# Patient Record
Sex: Male | Born: 1975
Health system: Southern US, Community
[De-identification: ages and names within clinical notes are randomized; demographics above are authoritative.]

## PROBLEM LIST (undated history)

## (undated) DIAGNOSIS — K219 Gastro-esophageal reflux disease without esophagitis: Secondary | ICD-10-CM

## (undated) DIAGNOSIS — J45909 Unspecified asthma, uncomplicated: Secondary | ICD-10-CM

## (undated) DIAGNOSIS — E669 Obesity, unspecified: Secondary | ICD-10-CM

## (undated) HISTORY — PX: MOUTH SURGERY: SHX715

## (undated) HISTORY — DX: Obesity, unspecified: E66.9

## (undated) HISTORY — DX: Unspecified asthma, uncomplicated: J45.909

## (undated) HISTORY — DX: Gastro-esophageal reflux disease without esophagitis: K21.9

---

## 2000-10-12 ENCOUNTER — Inpatient Hospital Stay (HOSPITAL_COMMUNITY): Admission: EM | Admit: 2000-10-12 | Discharge: 2000-10-13 | Payer: Self-pay | Admitting: Emergency Medicine

## 2000-10-12 ENCOUNTER — Encounter: Payer: Self-pay | Admitting: Emergency Medicine

## 2000-10-15 ENCOUNTER — Encounter: Payer: Self-pay | Admitting: Emergency Medicine

## 2000-10-15 ENCOUNTER — Emergency Department (HOSPITAL_COMMUNITY): Admission: EM | Admit: 2000-10-15 | Discharge: 2000-10-15 | Payer: Self-pay | Admitting: Emergency Medicine

## 2006-02-06 ENCOUNTER — Emergency Department (HOSPITAL_COMMUNITY): Admission: EM | Admit: 2006-02-06 | Discharge: 2006-02-06 | Payer: Self-pay | Admitting: Emergency Medicine

## 2011-01-10 ENCOUNTER — Ambulatory Visit: Payer: Self-pay

## 2015-02-02 ENCOUNTER — Emergency Department (HOSPITAL_COMMUNITY)
Admission: EM | Admit: 2015-02-02 | Discharge: 2015-02-03 | Disposition: A | Discharge status: Home / Self Care | Payer: Self-pay | Attending: Emergency Medicine | Admitting: Emergency Medicine

## 2015-02-02 ENCOUNTER — Encounter (HOSPITAL_COMMUNITY): Payer: Self-pay | Admitting: *Deleted

## 2015-02-02 DIAGNOSIS — T1502XA Foreign body in cornea, left eye, initial encounter: Secondary | ICD-10-CM | POA: Insufficient documentation

## 2015-02-02 DIAGNOSIS — X58XXXA Exposure to other specified factors, initial encounter: Secondary | ICD-10-CM | POA: Insufficient documentation

## 2015-02-02 DIAGNOSIS — Y9389 Activity, other specified: Secondary | ICD-10-CM | POA: Insufficient documentation

## 2015-02-02 DIAGNOSIS — Y998 Other external cause status: Secondary | ICD-10-CM | POA: Insufficient documentation

## 2015-02-02 DIAGNOSIS — Y929 Unspecified place or not applicable: Secondary | ICD-10-CM | POA: Insufficient documentation

## 2015-02-02 DIAGNOSIS — Z720 Tobacco use: Secondary | ICD-10-CM | POA: Insufficient documentation

## 2015-02-02 DIAGNOSIS — T1592XA Foreign body on external eye, part unspecified, left eye, initial encounter: Secondary | ICD-10-CM

## 2015-02-02 MED ORDER — FLUORESCEIN SODIUM 1 MG OP STRP
1.0000 | ORAL_STRIP | Freq: Once | OPHTHALMIC | Status: AC
  Administered 2015-02-02: 1 via OPHTHALMIC
  Filled 2015-02-02: qty 1

## 2015-02-02 MED ORDER — TETRACAINE HCL 0.5 % OP SOLN
2.0000 [drp] | Freq: Once | OPHTHALMIC | Status: AC
  Administered 2015-02-02: 2 [drp] via OPHTHALMIC
  Filled 2015-02-02: qty 2

## 2015-02-02 NOTE — ED Notes (Signed)
MD at bedside. 

## 2015-02-02 NOTE — ED Provider Notes (Signed)
CSN: [id number]     Arrival date & time 02/02/15  2153 History   This chart was scribed for Dierdre Forth, PA-C working with Vanetta Mulders, MD by Elveria Rising, ED Scribe. This patient was seen in room TR04C/TR04C and the patient's care was started at 11:57 PM.   Chief Complaint  Patient presents with  . Foreign Body in Eye   HPI HPI Comments: Cole Morales is a 39 y.o. male who presents to the Emergency Department complaining of suspected foreign body in his left eye. Patient reports grinding metal yesterday; states he was wearing goggles, but reports shards made it to his eye. Patient reports blurred peripheral vision, redness and pain.  He reports he has attempted to flush his eye without relief.  He does not wear contacts. He reports blurry vision in the left eye out of the left peripheral field but looking straight ahead he does not have blurry vision.  He denies fever, chills, headache, neck pain.    History reviewed. No pertinent past medical history. Past Surgical History  Procedure Laterality Date  . Mouth surgery     History reviewed. No pertinent family history. History  Substance Use Topics  . Smoking status: Current Every Day Smoker    Types: Cigarettes  . Smokeless tobacco: Never Used  . Alcohol Use: Yes    Review of Systems  Constitutional: Negative for fever and chills.  HENT: Negative for sinus pressure and sore throat.   Eyes: Positive for pain, discharge and redness.  Gastrointestinal: Negative for nausea and vomiting.  Allergic/Immunologic: Negative for immunocompromised state.  Hematological: Negative for adenopathy.  All other systems reviewed and are negative.     Allergies  Review of patient's allergies indicates no known allergies.  Home Medications   Prior to Admission medications   Medication Sig Start Date End Date Taking? Authorizing Provider  erythromycin ophthalmic ointment Place a 1/2 inch ribbon of ointment into the lower eyelid  every 4 hours for 5 days 02/03/15   Dahlia Client Jericha Bryden, PA-C  ketorolac (ACULAR) 0.5 % ophthalmic solution Place 1 drop into both eyes every 6 (six) hours. 02/03/15   Kerie Badger, PA-C   Triage Vitals: BP 117/71 mmHg  Pulse 100  Temp(Src) 98.3 F (36.8 C) (Oral)  Resp 16  SpO2 95% Physical Exam  Constitutional: He is oriented to person, place, and time. He appears well-developed and well-nourished. No distress.  HENT:  Head: Normocephalic and atraumatic.  Nose: Nose normal. No mucosal edema or rhinorrhea.  Mouth/Throat: Uvula is midline, oropharynx is clear and moist and mucous membranes are normal. No uvula swelling. No oropharyngeal exudate, posterior oropharyngeal edema, posterior oropharyngeal erythema or tonsillar abscesses.  Eyes: Conjunctivae, EOM and lids are normal. Pupils are equal, round, and reactive to light. Lids are everted and swept, no foreign bodies found. Right eye exhibits no chemosis, no discharge and no exudate. No foreign body present in the right eye. Left eye exhibits no chemosis, no discharge and no exudate. No foreign body present in the left eye. Right conjunctiva is not injected. Right conjunctiva has no hemorrhage. Left conjunctiva is not injected. Left conjunctiva has no hemorrhage.  Slit lamp exam:      The right eye shows no corneal abrasion, no corneal flare, no corneal ulcer, no foreign body, no fluorescein uptake and no anterior chamber bulge.       The left eye shows no corneal abrasion, no corneal flare, no corneal ulcer, no foreign body, no fluorescein uptake and no anterior chamber  bulge.  Pupils equal round and reactive to light No vertical, horizontal or rotational nystagmus Small corneal defect noted to the left eye at the 4 o'clock position with fluorescein uptake and visible foreign body with rust ring No corneal flare, ulcer or dendritic staining  No herpetic lesions to the face or around the eye  Visual Acuity - Bilateral Distance: 20/20  ; R Distance: 20/20 ; L Distance: 20/100  Neck: Normal range of motion.  Full range of motion without pain No midline or paraspinal tenderness No nuchal rigidity; no meningeal signs  Cardiovascular: Normal rate, regular rhythm and intact distal pulses.   Pulmonary/Chest: Effort normal. No respiratory distress.  Musculoskeletal: Normal range of motion.  Neurological: He is alert and oriented to person, place, and time.  Mental Status:  Alert, oriented, thought content appropriate. Speech fluent without evidence of aphasia. Able to follow 2 step commands without difficulty.   Cranial Nerves:  II:  Peripheral visual fields grossly normal, pupils equal, round, reactive to light III,IV, VI: ptosis not present, extra-ocular motions intact bilaterally  V,VII: smile symmetric, facial light touch sensation equal VIII: hearing grossly normal bilaterally  IX,X: gag reflex present  XI: bilateral shoulder shrug equal and strong XII: midline tongue extension   Skin: Skin is warm and dry. He is not diaphoretic. No erythema.  Psychiatric: He has a normal mood and affect.  Nursing note and vitals reviewed.   ED Course  FOREIGN BODY REMOVAL Date/Time: 02/03/2015 12:46 AM Performed by: Dierdre Forth Authorized by: Dierdre Forth Consent: Verbal consent obtained. Risks and benefits: risks, benefits and alternatives were discussed Consent given by: patient Patient understanding: patient states understanding of the procedure being performed Patient consent: the patient's understanding of the procedure matches consent given Procedure consent: procedure consent matches procedure scheduled Relevant documents: relevant documents present and verified Site marked: the operative site was marked Required items: required blood products, implants, devices, and special equipment available Patient identity confirmed: verbally with patient and arm band Time out: Immediately prior to procedure a  "time out" was called to verify the correct patient, procedure, equipment, support staff and site/side marked as required. Body area: eye Location details: left cornea Local anesthetic: tetracaine drops Anesthetic total: 3 drops Patient sedated: no Patient restrained: no Patient cooperative: yes Localization method: eyelid eversion, magnification, slit lamp and visualized Removal mechanism: 25-gauge needle Eye examined with fluorescein. Fluorescein uptake. Corneal abrasion size: small Corneal abrasion location: lateral Residual rust ring present. Dressing: antibiotic ointment Depth: embedded Complexity: complex 1 objects recovered. Objects recovered: metal shard Post-procedure assessment: foreign body removed Patient tolerance: Patient tolerated the procedure well with no immediate complications   (including critical care time)  COORDINATION OF CARE: 12:00 AM- Discussed treatment plan with patient at bedside and patient agreed to plan.   Labs Review Labs Reviewed - No data to display  Imaging Review No results found.   EKG Interpretation None      MDM   Final diagnoses:  Foreign body, eye, left, initial encounter    [facility] presents with foreign body in the left eye.  Visible piece if metal embedded in the cornea.  Fluorescein uptake noted after foreign body removal at the site of the corneal defect. Negative Seidel sign. No large corneal abrasion.   Patient is not a contact lens wearer. Will discharge home with ketorolac and erythromycin. He is to follow-up with ophthalmology tomorrow morning for rust ring removal. Patient without direct or consensual photophobia to suggest iritis.   BP 117/71  mmHg  Pulse 100  Temp(Src) 98.3 F (36.8 C) (Oral)  Resp 16  SpO2 95%  I personally performed the services described in this documentation, which was scribed in my presence. The recorded information has been reviewed and is accurate.   Dahlia Client Marly Schuld,  PA-C 02/03/15 0938  Vanetta Mulders, MD 02/06/15 1410

## 2015-02-02 NOTE — ED Notes (Signed)
Pt c/o left pain since this afternoon. Pt reports using a grinder on a piece of metal and believes a piece of metal or dust got into his left eye. Eye is red and draining. Pt states he has been trying to flush it out without success.

## 2015-02-03 MED ORDER — ERYTHROMYCIN 5 MG/GM OP OINT: TOPICAL_OINTMENT | OPHTHALMIC | Status: DC

## 2015-02-03 MED ORDER — KETOROLAC TROMETHAMINE 0.5 % OP SOLN: 1.0000 [drp] | Freq: Four times a day (QID) | OPHTHALMIC | Status: DC

## 2015-02-03 NOTE — Discharge Instructions (Signed)
1. Medications: Ketorolac drops, erythromycin, usual home medications 2. Treatment: rest, drink plenty of fluids, do not rub eye 3. Follow Up: Please followup with Dr. Vonna Kotyk TOMORROW by calling his office in the morning and setting up an appointment for tomorrow.      Eye, Foreign Body The term foreign body refers to any object near, on the surface of or in the eye that should not be there. A foreign body may be a small speck of dirt or dust, a hair or eyelash, a splinter or any object. CAUSES  Foreign bodies can get in the eye by:  Flying pieces of something that was broken or destroyed (debris).  A sudden injury (trauma) to the eye. SYMPTOMS  Symptoms depend on what the foreign body is and where it is in the eye. The most common locations are:  On the inner surface of the upper or lower eyelids or on the covering of the white part of the eye (conjunctiva). Symptoms in this location are:  Irritating and painful, especially when blinking.  Feeling like something is in the eye.  On the surface of the clear covering on the front of the eye (cornea). A corneal foreign body has symptoms that:  Are painful and irritating since the cornea is very sensitive.  Form small "rust rings" around a metallic foreign body. Metallic foreign bodies stick more firmly to the surface of the cornea.  Inside the eyeball. Infection can happen fast and can be hard to treat with antibiotics. This is an extremely dangerous situation. Foreign bodies inside the eye can threaten vision. A person may even loose their eye. Foreign bodies inside the eye may cause:  Great pain.  Immediate loss of vision. DIAGNOSIS  Foreign bodies are found during an exam by an eye specialist. Those that are on the eyelids, conjunctiva or cornea are usually (but not always) easily found. When a foreign body is inside the eyeball, a cataract may form almost right away. This makes it hard for an ophthalmologist to find the foreign  body. Special tests may be needed, including ultrasound testing, X-rays and CT scans. TREATMENT   Foreign bodies that are on the eyelids, conjunctiva or cornea are often removed easily and painlessly.  If the foreign body has caused a scratch or abrasion of the cornea, antibiotic drops, ointments and/or a tight patch called a "pressure patch" may be needed. Follow-up exams will be needed for several days until the abrasion heals.  Surgery is needed right away if the foreign body is inside the eyeball. This is a medical emergency. An antibiotic therapy will likely be given to stop an infection. HOME CARE INSTRUCTIONS  The use of eye patches is not universal. Their use varies from state to state and from caregiver to caregiver. If an eye patch was applied:  Keep the eye patch on for as long as directed by your caregiver until the follow-up appointment.  Do not remove the patch to put in medications unless instructed to do so. When replacing the patch, retape it as it was before. Follow the same procedure if the patch becomes loose.  WARNING: Do not drive or operate machinery while the eye is patched. The ability to judge distances will be impaired.  Only take over-the-counter or prescription medicines for pain, discomfort or fever as directed by the caregiver. If no eye patch was applied:  Keep the eye closed as much as possible. Do not rub the eye.  Wear dark glasses as needed to protect  the eyes from bright light.  Do not wear contact lenses until the eye feels normal again, or as instructed.  Wear protective eye covering if there is a risk of eye injury. This is important when working with high speed tools.  Only take over-the-counter or prescription medicines for pain, discomfort or fever as directed by the caregiver. SEEK IMMEDIATE MEDICAL CARE IF:   Pain increases in the eye or the vision changes.  You or your child has problems with the eye patch.  The injury to the eye  appears to be getting larger.  There is discharge from the injured eye.  Swelling and/or soreness (inflammation) develops around the affected eye.  You or your child has an oral temperature above 102 F (38.9 C), not controlled by medicine.  Your baby is older than 3 months with a rectal temperature of 102 F (38.9 C) or higher.  Your baby is 44 months old or younger with a rectal temperature of 100.4 F (38 C) or higher. MAKE SURE YOU:   Understand these instructions.  Will watch your condition.  Will get help right away if you are not doing well or get worse. Document Released: 07/30/2005 Document Revised: 10/22/2011 Document Reviewed: 12/25/2012 [facility] Patient Information 2015 [place], [place]. This information is not intended to replace advice given to you by your health care provider. Make sure you discuss any questions you have with your health care provider.

## 2015-02-03 NOTE — ED Notes (Signed)
Patient is alert and orientedx4.  Patient was explained discharge instructions and they understood them with no questions.   

## 2018-01-17 ENCOUNTER — Encounter (HOSPITAL_COMMUNITY): Payer: Self-pay | Admitting: Emergency Medicine

## 2018-01-17 ENCOUNTER — Emergency Department (HOSPITAL_COMMUNITY)
Admission: EM | Admit: 2018-01-17 | Discharge: 2018-01-18 | Disposition: A | Discharge status: Home / Self Care | Payer: Self-pay | Attending: Emergency Medicine | Admitting: Emergency Medicine

## 2018-01-17 ENCOUNTER — Other Ambulatory Visit: Payer: Self-pay

## 2018-01-17 ENCOUNTER — Emergency Department (HOSPITAL_COMMUNITY): Payer: Self-pay

## 2018-01-17 DIAGNOSIS — R0789 Other chest pain: Secondary | ICD-10-CM | POA: Insufficient documentation

## 2018-01-17 DIAGNOSIS — F17210 Nicotine dependence, cigarettes, uncomplicated: Secondary | ICD-10-CM | POA: Insufficient documentation

## 2018-01-17 LAB — CBC
HEMATOCRIT: 50.7 % (ref 39.0–52.0)
HEMOGLOBIN: 17 g/dL (ref 13.0–17.0)
MCH: 29.1 pg (ref 26.0–34.0)
MCHC: 33.5 g/dL (ref 30.0–36.0)
MCV: 86.8 fL (ref 78.0–100.0)
Platelets: 219 10*3/uL (ref 150–400)
RBC: 5.84 MIL/uL — ABNORMAL HIGH (ref 4.22–5.81)
RDW: 12.3 % (ref 11.5–15.5)
WBC: 7.7 10*3/uL (ref 4.0–10.5)

## 2018-01-17 LAB — BASIC METABOLIC PANEL
ANION GAP: 10 (ref 5–15)
BUN: 6 mg/dL (ref 6–20)
CO2: 25 mmol/L (ref 22–32)
Calcium: 9.1 mg/dL (ref 8.9–10.3)
Chloride: 106 mmol/L (ref 101–111)
Creatinine, Ser: 0.92 mg/dL (ref 0.61–1.24)
GFR calc Af Amer: >60 mL/min (ref >60)
GFR calc non Af Amer: >60 mL/min (ref >60)
Glucose, Bld: 105 mg/dL — ABNORMAL HIGH (ref 65–99)
POTASSIUM: 3.5 mmol/L (ref 3.5–5.1)
Sodium: 141 mmol/L (ref 135–145)

## 2018-01-17 LAB — I-STAT TROPONIN, ED: Troponin i, poc: 0 ng/mL (ref 0.00–0.08)

## 2018-01-17 NOTE — ED Triage Notes (Signed)
C/o intermittent dull L sided chest pain and SOB since Tuesday.  States he believes pain is radiating down L arm but has a history of L shoulder pain as well.

## 2018-01-18 LAB — I-STAT TROPONIN, ED: Troponin i, poc: 0 ng/mL (ref 0.00–0.08)

## 2018-01-18 NOTE — ED Provider Notes (Signed)
[facility] EMERGENCY DEPARTMENT Provider Note   CSN: [id number] Arrival date & time: 01/17/18  2302     History   Chief Complaint Chief Complaint  Patient presents with  . Chest Pain    HPI Cole Morales is a 42 y.o. male with no significant past medical history presents today for evaluation of acute onset, waxing and waning left-sided chest pain since Tuesday 4 days ago.  He states that pain is a cramping sensation, does not radiate.  He does have chronic left shoulder pain but states that this is unchanged from his usual symptoms and does not think it is related to his chest pain.  He states when the pain intensifies in severity he will become mildly short of breath and nauseated but denies diaphoresis, lightheadedness, or vomiting.  Symptoms began on Tuesday, significantly improved until around 8 PM last night.  Pain is not exertional, pleuritic, or positional.  He denies fevers, chills, cough, hemoptysis, prior history of DVT or PE, recent travel or surgeries, or testosterone hormone placement therapy.  He did take 3 baby aspirin prior to presenting to the ED which he states was helpful for his symptoms.  He is a current smoker of a proximally pack of cigarettes daily but has not smoked since Tuesday secondary to his symptoms.  He has not been seen by primary care physician in several years but does not think he has any chronic medical conditions such as diabetes, hypertension, or hyperlipidemia.  He does drink approximately 2 L bottle of soda daily, denies recreational drug use or excessive alcohol intake. He states "I am just trying to make sure I am not having a heart attack".  The history is provided by the patient.    History reviewed. No pertinent past medical history.  There are no active problems to display for this patient.   Past Surgical History:  Procedure Laterality Date  . MOUTH SURGERY          Home Medications    Prior to Admission  medications   Not on File    Family History No family history on file.  Social History Social History   Tobacco Use  . Smoking status: Current Every Day Smoker    Types: Cigarettes  . Smokeless tobacco: Never Used  Substance Use Topics  . Alcohol use: Yes  . Drug use: Yes    Types: Marijuana     Allergies   Patient has no known allergies.   Review of Systems Review of Systems  Constitutional: Negative for chills, diaphoresis and fever.  Respiratory: Positive for shortness of breath. Negative for cough.   Cardiovascular: Positive for chest pain. Negative for palpitations and leg swelling.  Gastrointestinal: Positive for nausea. Negative for abdominal pain and vomiting.  All other systems reviewed and are negative.    Physical Exam Updated Vital Signs BP 108/76 (BP Location: Left Arm)   Pulse 64   Temp 98 F (36.7 C)   Resp 18   SpO2 98%   Physical Exam  Constitutional: He appears well-developed and well-nourished. No distress.  HENT:  Head: Normocephalic and atraumatic.  Eyes: Conjunctivae and EOM are normal. Right eye exhibits no discharge. Left eye exhibits no discharge.  Neck: Normal range of motion. Neck supple. No JVD present. No tracheal deviation present.  Cardiovascular: Normal rate and regular rhythm.  Pulses:      Carotid pulses are 2+ on the right side, and 2+ on the left side.  Radial pulses are 2+ on the right side, and 2+ on the left side.       Dorsalis pedis pulses are 2+ on the right side, and 2+ on the left side.       Posterior tibial pulses are 2+ on the right side, and 2+ on the left side.  Pulmonary/Chest: Effort normal. No accessory muscle usage or stridor. No tachypnea. No respiratory distress. He has wheezes.  Mild scattered expiratory wheezes on auscultation of lungs.  Equal rise and fall of chest, no increased work of breathing.  Speaking in full sentences without difficulty.  No tenderness to palpation of the chest wall    Abdominal: Soft. Bowel sounds are normal. He exhibits no distension. There is no tenderness.  Musculoskeletal: He exhibits no edema.       Right lower leg: Normal. He exhibits no tenderness and no edema.       Left lower leg: Normal. He exhibits no tenderness and no edema.  Neurological: He is alert.  Skin: Skin is warm and dry. No erythema.  Psychiatric: He has a normal mood and affect. His behavior is normal.  Nursing note and vitals reviewed.    ED Treatments / Results  Labs (all labs ordered are listed, but only abnormal results are displayed) Labs Reviewed  BASIC METABOLIC PANEL - Abnormal; Notable for the following components:      Result Value   Glucose, Bld 105 (*)    All other components within normal limits  CBC - Abnormal; Notable for the following components:   RBC 5.84 (*)    All other components within normal limits  I-STAT TROPONIN, ED  I-STAT TROPONIN, ED    EKG EKG Interpretation  Date/Time:  Friday January 17 2018 23:12:24 EDT Ventricular Rate:  64 PR Interval:  160 QRS Duration: 90 QT Interval:  388 QTC Calculation: 400 R Axis:   12 Text Interpretation:  Normal sinus rhythm Normal ECG No significant change was found Confirmed by Glynn Octave [phone]) on 01/18/2018 7:20:33 AM   Radiology Dg Chest 2 View  Result Date: 01/17/2018 CLINICAL DATA:  Left-sided chest pain and shortness-of-breath 3-4 days. EXAM: CHEST - 2 VIEW COMPARISON:  02/06/2006 FINDINGS: Lungs are adequately inflated without consolidation or effusion. Cardiomediastinal silhouette is within normal. Mild anterior wedging of a vertebral body near the thoracolumbar junction. IMPRESSION: No active cardiopulmonary disease. Minimal anterior wedging of a vertebral body near the thoracolumbar junction likely chronic. Electronically Signed   By: Elberta Fortis M.D.   On: 01/17/2018 23:36    Procedures Procedures (including critical care time)  Medications Ordered in ED Medications - No data to  display   Initial Impression / Assessment and Plan / ED Course  I have reviewed the triage vital signs and the nursing notes.  Pertinent labs & imaging results that were available during my care of the patient were reviewed by me and considered in my medical decision making (see chart for details).     Patient presents for evaluation of intermittent left-sided chest pain for the past 4 days of uncertain etiology.  He is afebrile, vital signs are stable.  He is nontoxic in appearance.  Chest pain is not reproducible on palpation however he does tell me it is located to a focal area along the left anterior chest.  Chest x-ray shows no acute cardiopulmonary abnormalities but does show minimal wedging of a vertebral body in the thoracolumbar region which is likely chronic.  His EKG shows normal sinus rhythm with  no significant changes from last tracing.  Serial troponins are negative and the remainder of his lab work shows no leukocytosis, no anemia, no significant electrolyte abnormalities.  I doubt pneumonia, pleural effusion, pericarditis, myocarditis, or dissection.  No evidence of ACS or MI.  I doubt PE and he is PERC negative.  No further emergent work-up required at this time.  He is low risk for cardiac disease however has not been evaluated by primary care physician so is unsure if he has any comorbidities.  He is attempting to quit smoking.  Encouraged the patient to follow-up with [place] and wellness for reevaluation.  Recommend NSAIDs and Tylenol as needed for pain in the meantime.  Discussed strict ED return precautions.  Patient and patient's mother verbalized understanding of and agreement with plan and patient is stable for discharge home at this time.  Final Clinical Impressions(s) / ED Diagnoses   Final diagnoses:  Atypical chest pain    ED Discharge Orders    None       Jeanie SewerFawze, Ruston Fedora A, PA-C 01/18/18 [id number]    Glynn Octaveancour, Stephen, MD 01/18/18 [phone]

## 2018-01-18 NOTE — Discharge Instructions (Signed)
Your work-up today was reassuring.  There is no evidence that you are having a heart attack. Alternate 600 mg of ibuprofen and [phone] mg of Tylenol every 3 hours as needed for pain for the next 3 to 5 days. Do not exceed 4000 mg of Tylenol daily.  Take ibuprofen with food to avoid upset stomach issues.  Call [place] [facility] to set up a follow-up appointment.  Tell them you were referred from the emergency department.  Return to the emergency department if any concerning signs or symptoms develop such as persistent chest pain, shortness of breath, high fevers, or coughing up blood.

## 2018-02-01 ENCOUNTER — Emergency Department (HOSPITAL_COMMUNITY)
Admission: EM | Admit: 2018-02-01 | Discharge: 2018-02-01 | Disposition: A | Discharge status: Home / Self Care | Payer: Self-pay | Attending: Emergency Medicine | Admitting: Emergency Medicine

## 2018-02-01 ENCOUNTER — Emergency Department (HOSPITAL_COMMUNITY): Payer: Self-pay

## 2018-02-01 ENCOUNTER — Encounter (HOSPITAL_COMMUNITY): Payer: Self-pay

## 2018-02-01 ENCOUNTER — Other Ambulatory Visit: Payer: Self-pay

## 2018-02-01 DIAGNOSIS — Z720 Tobacco use: Secondary | ICD-10-CM

## 2018-02-01 DIAGNOSIS — R0789 Other chest pain: Secondary | ICD-10-CM | POA: Insufficient documentation

## 2018-02-01 DIAGNOSIS — R42 Dizziness and giddiness: Secondary | ICD-10-CM | POA: Insufficient documentation

## 2018-02-01 DIAGNOSIS — R05 Cough: Secondary | ICD-10-CM | POA: Insufficient documentation

## 2018-02-01 DIAGNOSIS — F17210 Nicotine dependence, cigarettes, uncomplicated: Secondary | ICD-10-CM | POA: Insufficient documentation

## 2018-02-01 DIAGNOSIS — R0602 Shortness of breath: Secondary | ICD-10-CM | POA: Insufficient documentation

## 2018-02-01 LAB — BASIC METABOLIC PANEL
ANION GAP: 7 (ref 5–15)
BUN: 7 mg/dL (ref 6–20)
CHLORIDE: 106 mmol/L (ref 101–111)
CO2: 29 mmol/L (ref 22–32)
Calcium: 9.6 mg/dL (ref 8.9–10.3)
Creatinine, Ser: 0.88 mg/dL (ref 0.61–1.24)
Glucose, Bld: 106 mg/dL — ABNORMAL HIGH (ref 65–99)
Potassium: 4.4 mmol/L (ref 3.5–5.1)
Sodium: 142 mmol/L (ref 135–145)

## 2018-02-01 LAB — I-STAT TROPONIN, ED: Troponin i, poc: 0 ng/mL (ref 0.00–0.08)

## 2018-02-01 LAB — CBC
HEMATOCRIT: 50.4 % (ref 39.0–52.0)
HEMOGLOBIN: 16.8 g/dL (ref 13.0–17.0)
MCH: 28.7 pg (ref 26.0–34.0)
MCHC: 33.3 g/dL (ref 30.0–36.0)
MCV: 86.2 fL (ref 78.0–100.0)
Platelets: 180 10*3/uL (ref 150–400)
RBC: 5.85 MIL/uL — AB (ref 4.22–5.81)
RDW: 11.8 % (ref 11.5–15.5)
WBC: 7.3 10*3/uL (ref 4.0–10.5)

## 2018-02-01 MED ORDER — PREDNISONE 20 MG PO TABS: ORAL_TABLET | ORAL | 0 refills | Status: DC

## 2018-02-01 MED ORDER — ALBUTEROL SULFATE HFA 108 (90 BASE) MCG/ACT IN AERS
2.0000 | INHALATION_SPRAY | RESPIRATORY_TRACT | Status: DC | PRN
  Administered 2018-02-01: 2 via RESPIRATORY_TRACT
  Filled 2018-02-01: qty 6.7

## 2018-02-01 NOTE — Discharge Instructions (Signed)
Your shortness of breath may be early sign of chronic obstructive pulmonary disease, as a result of tobacco use.  Seek help to stop smoking.  Use albuterol inhaler 2 puffs every 4 hrs as needed for shortness of breath.  Find a primary care provider by using the number below.  Return if you have any concerns.

## 2018-02-01 NOTE — ED Notes (Signed)
Pt oxygen level stayed at 99% while ambulating. Pt did not complain of SOB or of dizziness while ambulating.

## 2018-02-01 NOTE — ED Triage Notes (Signed)
Pt endorses left sided CP x 2 weeks constant, "worse when I have shob and dizziness" denies n/v. VSS. Here for same 2 weeks ago and was d/c home.

## 2018-02-01 NOTE — ED Provider Notes (Signed)
[facility] EMERGENCY DEPARTMENT Provider Note   CSN: [id number] Arrival date & time: 02/01/18  1314     History   Chief Complaint Chief Complaint  Patient presents with  . Chest Pain    HPI Cole Morales is a 42 y.o. male.  HPI   42 year old male with history of tobacco use presenting for evaluation of chest pain.  Patient report for the past 2 to 3 weeks he has had progressive worsening shortness of breath, intermittent chest pain and occasional bouts of dizziness.  Chest pain is located to left upper chest, feels like a muscle pulled, happens sporadically but not present with exertion.  Shortness of breath is with ambulation.  Dizziness is described more slight lightheadedness.  Patient was seen in the ED 2 weeks ago for his symptoms.  Work-up at that time was unremarkable.  Patient was discharged home to follow-up with PCP.  He has not had a chance to establish PCP care yet.  He mentioned that chest pain is minimal but still concerned about the shortness of breath.  He has decided to quit smoking and have been for the past 2 weeks.  No report of wheezing, fever, chills, URI symptoms, nausea vomiting abdominal pain.  He denies any prior history of PE DVT, no recent surgery, prolonged bedrest, active cancer, hemoptysis.  He was smoking a pack a day.  Does endorse dry cough, and having to clear his throat often.  History reviewed. No pertinent past medical history.  There are no active problems to display for this patient.   Past Surgical History:  Procedure Laterality Date  . MOUTH SURGERY          Home Medications    Prior to Admission medications   Not on File    Family History History reviewed. No pertinent family history.  Social History Social History   Tobacco Use  . Smoking status: Current Every Day Smoker    Types: Cigarettes  . Smokeless tobacco: Never Used  Substance Use Topics  . Alcohol use: Yes  . Drug use: Yes    Types:  Marijuana     Allergies   Patient has no known allergies.   Review of Systems Review of Systems  All other systems reviewed and are negative.    Physical Exam Updated Vital Signs BP (!) 147/108 (BP Location: Right Arm)   Pulse 61   Temp 98 F (36.7 C) (Oral)   Resp 16   Ht 5\' 9"  (1.753 m)   Wt 127 kg (280 lb)   SpO2 99%   BMI 41.35 kg/m   Physical Exam  Constitutional: He is oriented to person, place, and time. He appears well-developed and well-nourished. No distress.  HENT:  Head: Atraumatic.  Eyes: Conjunctivae are normal.  Neck: Neck supple.  Cardiovascular: Normal rate, regular rhythm, intact distal pulses and normal pulses.  Pulmonary/Chest: Effort normal. He has decreased breath sounds. He has no wheezes. He has no rhonchi. He has no rales.  Abdominal: Soft. There is no tenderness.  Musculoskeletal: Normal range of motion.       Right lower leg: He exhibits no edema.       Left lower leg: He exhibits no edema.  Neurological: He is alert and oriented to person, place, and time.  Skin: No rash noted.  Psychiatric: He has a normal mood and affect.  Nursing note and vitals reviewed.    ED Treatments / Results  Labs (all labs ordered are listed, but only  abnormal results are displayed) Labs Reviewed  BASIC METABOLIC PANEL - Abnormal; Notable for the following components:      Result Value   Glucose, Bld 106 (*)    All other components within normal limits  CBC - Abnormal; Notable for the following components:   RBC 5.85 (*)    All other components within normal limits  I-STAT TROPONIN, ED    EKG None   Date: 02/01/2018  Rate: 64  Rhythm: normal sinus rhythm  QRS Axis: normal  Intervals: normal  ST/T Wave abnormalities: normal  Conduction Disutrbances: none  Narrative Interpretation:   Old EKG Reviewed: No significant changes noted     Radiology Dg Chest 2 View  Result Date: 02/01/2018 CLINICAL DATA:  Left-sided chest pain for 2 weeks.  EXAM: CHEST - 2 VIEW COMPARISON:  01/17/2018 FINDINGS: Lung markings are slightly coarse but unchanged. There is no focal airspace disease or pulmonary edema. Heart and mediastinum are within normal limits. Trachea is midline. No acute bone abnormality. No pleural effusions. Negative for a pneumothorax. IMPRESSION: No active cardiopulmonary disease. Electronically Signed   By: Richarda OverlieAdam  Henn M.D.   On: 02/01/2018 14:13    Procedures Procedures (including critical care time)  Medications Ordered in ED Medications  albuterol (PROVENTIL HFA;VENTOLIN HFA) 108 (90 Base) MCG/ACT inhaler 2 puff (has no administration in time range)     Initial Impression / Assessment and Plan / ED Course  I have reviewed the triage vital signs and the nursing notes.  Pertinent labs & imaging results that were available during my care of the patient were reviewed by me and considered in my medical decision making (see chart for details).     BP 128/75   Pulse (!) 57   Temp 98 F (36.7 C) (Oral)   Resp 16   Ht 5\' 9"  (1.753 m)   Wt 127 kg (280 lb)   SpO2 99%   BMI 41.35 kg/m    Final Clinical Impressions(s) / ED Diagnoses   Final diagnoses:  Atypical chest pain  Shortness of breath  Tobacco abuse    ED Discharge Orders        Ordered    predniSONE (DELTASONE) 20 MG tablet     02/01/18 1529     2:48 PM Patient here with recurrent shortness of breath and chest discomfort which has been an ongoing issue for the past several weeks.  He is a heavy smoker that recently quit several weeks ago.  He was seen and evaluated 2 weeks ago for his complaint.  Work-up at that time was unremarkable.  Today, labs are reassuring, normal chest x-ray, normal EKG, electro lites panels are reassuring, normal troponin.  He is PERC negative, low suspicion for PE.  His heart score is 2, low risk of MACE.  I am concerning for potential COPD given his significant tobacco abuse.  Patient discharged home with albuterol inhaler to  use as needed.  Encourage patient to continue with smoking cessation and follow-up with primary care provider for further care.  Return precautions discussed.  We did discuss option of obtaining d-dimer or potential chest CTA to rule out PE but after our discussion, we will hold off on this diagnostic at this time.   Encourage outpt f/u and return precaution given.  Pt otherwise ambulate without hypoxia.  Stable for discharge    Fayrene Helperran, Cindy Brindisi, Cordelia Poche-C 02/01/18 1532    Margarita Grizzleay, Danielle, MD 02/01/18 1609    Margarita Grizzleay, Danielle, MD 02/01/18 [phone]

## 2018-02-18 ENCOUNTER — Encounter (HOSPITAL_COMMUNITY): Payer: Self-pay | Admitting: Emergency Medicine

## 2018-02-18 ENCOUNTER — Emergency Department (HOSPITAL_COMMUNITY): Payer: Self-pay

## 2018-02-18 ENCOUNTER — Emergency Department (HOSPITAL_COMMUNITY)
Admission: EM | Admit: 2018-02-18 | Discharge: 2018-02-19 | Disposition: A | Discharge status: Home / Self Care | Payer: Self-pay | Attending: Emergency Medicine | Admitting: Emergency Medicine

## 2018-02-18 DIAGNOSIS — Z87891 Personal history of nicotine dependence: Secondary | ICD-10-CM | POA: Insufficient documentation

## 2018-02-18 DIAGNOSIS — J441 Chronic obstructive pulmonary disease with (acute) exacerbation: Secondary | ICD-10-CM | POA: Insufficient documentation

## 2018-02-18 LAB — BASIC METABOLIC PANEL
ANION GAP: 10 (ref 5–15)
BUN: 7 mg/dL (ref 6–20)
CALCIUM: 9.1 mg/dL (ref 8.9–10.3)
CHLORIDE: 105 mmol/L (ref 98–111)
CO2: 24 mmol/L (ref 22–32)
Creatinine, Ser: 0.95 mg/dL (ref 0.61–1.24)
GFR calc non Af Amer: >60 mL/min (ref >60)
Glucose, Bld: 152 mg/dL — ABNORMAL HIGH (ref 70–99)
Potassium: 3.5 mmol/L (ref 3.5–5.1)
Sodium: 139 mmol/L (ref 135–145)

## 2018-02-18 LAB — CBC
HEMATOCRIT: 44.3 % (ref 39.0–52.0)
HEMOGLOBIN: 14.8 g/dL (ref 13.0–17.0)
MCH: 28.8 pg (ref 26.0–34.0)
MCHC: 33.4 g/dL (ref 30.0–36.0)
MCV: 86.4 fL (ref 78.0–100.0)
Platelets: 201 10*3/uL (ref 150–400)
RBC: 5.13 MIL/uL (ref 4.22–5.81)
RDW: 12 % (ref 11.5–15.5)
WBC: 7.2 10*3/uL (ref 4.0–10.5)

## 2018-02-18 LAB — I-STAT TROPONIN, ED: TROPONIN I, POC: 0 ng/mL (ref 0.00–0.08)

## 2018-02-18 NOTE — ED Triage Notes (Signed)
Pt reports some sob that started about 4-5 hours ago. Pt reports taking breathing tx at home with some relief. Pt reports 2/10 left chest heaviness. Pt regular and unlabored respirations with clear lung sounds. Pt denies cough.

## 2018-02-19 LAB — D-DIMER, QUANTITATIVE (NOT AT ARMC)

## 2018-02-19 MED ORDER — ALBUTEROL SULFATE (2.5 MG/3ML) 0.083% IN NEBU
2.5000 mg | INHALATION_SOLUTION | Freq: Once | RESPIRATORY_TRACT | Status: AC
  Administered 2018-02-19: 2.5 mg via RESPIRATORY_TRACT
  Filled 2018-02-19: qty 3

## 2018-02-19 MED ORDER — AMOXICILLIN 500 MG PO CAPS: 1000.0000 mg | ORAL_CAPSULE | Freq: Two times a day (BID) | ORAL | 0 refills | Status: DC

## 2018-02-19 MED ORDER — PREDNISONE 20 MG PO TABS: ORAL_TABLET | ORAL | 0 refills | Status: DC

## 2018-02-19 MED ORDER — IPRATROPIUM-ALBUTEROL 0.5-2.5 (3) MG/3ML IN SOLN
3.0000 mL | Freq: Once | RESPIRATORY_TRACT | Status: AC
  Administered 2018-02-19: 3 mL via RESPIRATORY_TRACT
  Filled 2018-02-19: qty 3

## 2018-02-19 MED ORDER — METHYLPREDNISOLONE SODIUM SUCC 125 MG IJ SOLR
125.0000 mg | Freq: Once | INTRAMUSCULAR | Status: AC
  Administered 2018-02-19: 125 mg via INTRAVENOUS
  Filled 2018-02-19: qty 2

## 2018-02-19 NOTE — ED Notes (Signed)
Pt is requesting more information about the comment made by provider regarding significant changes due to smoking.

## 2018-02-19 NOTE — ED Notes (Signed)
ED Provider at bedside. 

## 2018-02-19 NOTE — ED Provider Notes (Signed)
[facility] Provider Note   CSN: [id number] Arrival date & time: 02/18/18  2146     History   Chief Complaint Chief Complaint  Patient presents with  . Shortness of Breath    HPI Cole Morales is a 42 y.o. male.  Patient presents to the emergency department for evaluation of shortness of breath.  He reports that the symptoms began 4 to 5 hours ago.  He has recently been started on bronchodilator therapy for presumed COPD.  He has tried to use his albuterol today but has not had much improvement.  He denies any associated cough.  He does have some mild tightness on the left side of his chest which is similar to what he has experienced in the past with his shortness of breath.     History reviewed. No pertinent past medical history.  There are no active problems to display for this patient.   Past Surgical History:  Procedure Laterality Date  . MOUTH SURGERY          Home Medications    Prior to Admission medications   Medication Sig Start Date End Date Taking? Authorizing Provider  amoxicillin (AMOXIL) 500 MG capsule Take 2 capsules (1,000 mg total) by mouth 2 (two) times daily. 02/19/18   Gilda CreasePollina, Zonnique Norkus J, MD  predniSONE (DELTASONE) 20 MG tablet 3 tabs po daily x 3 days, then 2 tabs x 3 days, then 1.5 tabs x 3 days, then 1 tab x 3 days, then 0.5 tabs x 3 days 02/19/18   Gilda CreasePollina, Ossie Beltran J, MD    Family History No family history on file.  Social History Social History   Tobacco Use  . Smoking status: Former Smoker    Types: Cigarettes    Last attempt to quit: 01/19/2018    Years since quitting: 0.0  . Smokeless tobacco: Never Used  Substance Use Topics  . Alcohol use: Yes  . Drug use: Yes    Types: Marijuana     Allergies   Patient has no known allergies.   Review of Systems Review of Systems  Respiratory: Positive for chest tightness and shortness of breath.   All other systems reviewed and are  negative.    Physical Exam Updated Vital Signs BP 109/78   Pulse 67   Temp 98.3 F (36.8 C) (Oral)   Resp 17   Ht 5\' 9"  (1.753 m)   Wt 127 kg (280 lb)   SpO2 95%   BMI 41.35 kg/m   Physical Exam  Constitutional: He is oriented to person, place, and time. He appears well-developed and well-nourished. No distress.  HENT:  Head: Normocephalic and atraumatic.  Right Ear: Hearing normal.  Left Ear: Hearing normal.  Nose: Nose normal.  Mouth/Throat: Oropharynx is clear and moist and mucous membranes are normal.  Eyes: Pupils are equal, round, and reactive to light. Conjunctivae and EOM are normal.  Neck: Normal range of motion. Neck supple.  Cardiovascular: Regular rhythm, S1 normal and S2 normal. Exam reveals no gallop and no friction rub.  No murmur heard. Pulmonary/Chest: Effort normal. No respiratory distress. He has decreased breath sounds. He exhibits no tenderness.  Abdominal: Soft. Normal appearance and bowel sounds are normal. There is no hepatosplenomegaly. There is no tenderness. There is no rebound, no guarding, no tenderness at McBurney's point and negative Murphy's sign. No hernia.  Musculoskeletal: Normal range of motion.  Neurological: He is alert and oriented to person, place, and time. He has normal strength.  No cranial nerve deficit or sensory deficit. Coordination normal. GCS eye subscore is 4. GCS verbal subscore is 5. GCS motor subscore is 6.  Skin: Skin is warm, dry and intact. No rash noted. No cyanosis.  Psychiatric: He has a normal mood and affect. His speech is normal and behavior is normal. Thought content normal.  Nursing note and vitals reviewed.    ED Treatments / Results  Labs (all labs ordered are listed, but only abnormal results are displayed) Labs Reviewed  BASIC METABOLIC PANEL - Abnormal; Notable for the following components:      Result Value   Glucose, Bld 152 (*)    All other components within normal limits  CBC  D-DIMER, QUANTITATIVE  (NOT AT [facility])  I-STAT TROPONIN, ED    EKG EKG Interpretation  Date/Time:  Tuesday February 18 2018 22:02:20 EDT Ventricular Rate:  69 PR Interval:  146 QRS Duration: 90 QT Interval:  378 QTC Calculation: 405 R Axis:   -4 Text Interpretation:  Normal sinus rhythm Minimal voltage criteria for LVH, may be normal variant Borderline ECG Confirmed by Gilda Crease [phone]) on 02/19/2018 12:27:37 AM   Radiology Dg Chest 2 View  Result Date: 02/18/2018 CLINICAL DATA:  Dyspnea starting 4-5 hours ago. Denies cough. Left-sided chest heaviness. Ex-smoker. EXAM: CHEST - 2 VIEW COMPARISON:  02/01/2018 FINDINGS: The heart size and mediastinal contours are within normal limits. Smoking related bronchitic change with increased interstitial lung markings and peribronchial thickening are redemonstrated. No pulmonary consolidation, effusion or pneumothorax. The visualized skeletal structures are unremarkable. IMPRESSION: Smoking related bronchitic change of the lungs. Electronically Signed   By: Tollie Eth M.D.   On: 02/18/2018 22:24    Procedures Procedures (including critical care time)  Medications Ordered in ED Medications  methylPREDNISolone sodium succinate (SOLU-MEDROL) 125 mg/2 mL injection 125 mg (125 mg Intravenous Given 02/19/18 0246)  ipratropium-albuterol (DUONEB) 0.5-2.5 (3) MG/3ML nebulizer solution 3 mL (3 mLs Nebulization Given 02/19/18 0247)  albuterol (PROVENTIL) (2.5 MG/3ML) 0.083% nebulizer solution 2.5 mg (2.5 mg Nebulization Given 02/19/18 0247)     Initial Impression / Assessment and Plan / ED Course  I have reviewed the triage vital signs and the nursing notes.  Pertinent labs & imaging results that were available during my care of the patient were reviewed by me and considered in my medical decision making (see chart for details).     Patient presents to the emergency department for evaluation of shortness of breath.  Symptoms began for 5 hours before coming to the ER.   Patient does have recently diagnosed COPD.  He has not been aggressively using his bronchodilator therapy.  His work-up here in the ER has been unremarkable.  No sign of congestive heart failure.  No sign of acute coronary syndrome.  D-dimer negative.  Patient did have diminished breath sounds bilaterally and did have significant improvement with albuterol and Atrovent.  His oxygenation is adequate, does not require hospitalization.  Will discharge with aggressive treatment for COPD.  Final Clinical Impressions(s) / ED Diagnoses   Final diagnoses:  COPD exacerbation [facility])    ED Discharge Orders        Ordered    amoxicillin (AMOXIL) 500 MG capsule  2 times daily     02/19/18 0302    predniSONE (DELTASONE) 20 MG tablet     02/19/18 0302       Gilda Crease, MD 02/19/18 0302

## 2018-02-19 NOTE — ED Notes (Signed)
Pt departed in NAD.  

## 2018-03-30 ENCOUNTER — Emergency Department (HOSPITAL_COMMUNITY)
Admission: EM | Admit: 2018-03-30 | Discharge: 2018-03-31 | Disposition: A | Discharge status: Home / Self Care | Payer: Self-pay | Attending: Emergency Medicine | Admitting: Emergency Medicine

## 2018-03-30 ENCOUNTER — Emergency Department (HOSPITAL_COMMUNITY): Payer: Self-pay

## 2018-03-30 ENCOUNTER — Encounter (HOSPITAL_COMMUNITY): Payer: Self-pay | Admitting: Emergency Medicine

## 2018-03-30 ENCOUNTER — Other Ambulatory Visit: Payer: Self-pay

## 2018-03-30 DIAGNOSIS — Z87891 Personal history of nicotine dependence: Secondary | ICD-10-CM | POA: Insufficient documentation

## 2018-03-30 DIAGNOSIS — R0602 Shortness of breath: Secondary | ICD-10-CM | POA: Insufficient documentation

## 2018-03-30 DIAGNOSIS — R079 Chest pain, unspecified: Secondary | ICD-10-CM | POA: Insufficient documentation

## 2018-03-30 LAB — CBC
HCT: 46.5 % (ref 39.0–52.0)
Hemoglobin: 15.5 g/dL (ref 13.0–17.0)
MCH: 28.8 pg (ref 26.0–34.0)
MCHC: 33.3 g/dL (ref 30.0–36.0)
MCV: 86.4 fL (ref 78.0–100.0)
PLATELETS: 226 10*3/uL (ref 150–400)
RBC: 5.38 MIL/uL (ref 4.22–5.81)
RDW: 12.2 % (ref 11.5–15.5)
WBC: 9 10*3/uL (ref 4.0–10.5)

## 2018-03-30 LAB — BASIC METABOLIC PANEL
Anion gap: 10 (ref 5–15)
BUN: 6 mg/dL (ref 6–20)
CALCIUM: 9 mg/dL (ref 8.9–10.3)
CO2: 23 mmol/L (ref 22–32)
CREATININE: 1.02 mg/dL (ref 0.61–1.24)
Chloride: 105 mmol/L (ref 98–111)
GFR calc Af Amer: >60 mL/min (ref >60)
Glucose, Bld: 151 mg/dL — ABNORMAL HIGH (ref 70–99)
Potassium: 3.4 mmol/L — ABNORMAL LOW (ref 3.5–5.1)
SODIUM: 138 mmol/L (ref 135–145)

## 2018-03-30 LAB — I-STAT TROPONIN, ED: Troponin i, poc: 0 ng/mL (ref 0.00–0.08)

## 2018-03-30 MED ORDER — GI COCKTAIL ~~LOC~~
30.0000 mL | Freq: Once | ORAL | Status: AC
  Administered 2018-03-30: 30 mL via ORAL
  Filled 2018-03-30: qty 30

## 2018-03-30 NOTE — ED Provider Notes (Signed)
[facility] EMERGENCY DEPARTMENT Provider Note   CSN: [id number] Arrival date & time: 03/30/18  2153     History   Chief Complaint Chief Complaint  Patient presents with  . Chest Pain    HPI Cole Morales is a 42 y.o. male.  Patient presents to the emergency department with a chief complaint of chest pain or shortness of breath.  He reports that he has been having the symptoms for the past 2 to 3 days.  He describes the pain as dull and in the center of his chest.  States that it moves to the left side of his chest.  He denies any radiating pain down his arms.  Denies any diaphoresis.  Denies nausea or vomiting.  He denies fever, chills, or productive cough.  He states that he has been seen multiple times for the same, and has been thought that he might have COPD.  He is a former smoker, and states that he does have frequent wheezing, and uses albuterol breathing treatments when he gets from a family member.  He has never followed up with anyone for these symptoms.  He states that he is feeling improved now.  The history is provided by the patient. No language interpreter was used.    History reviewed. No pertinent past medical history.  There are no active problems to display for this patient.   Past Surgical History:  Procedure Laterality Date  . MOUTH SURGERY          Home Medications    Prior to Admission medications   Medication Sig Start Date End Date Taking? Authorizing Provider  amoxicillin (AMOXIL) 500 MG capsule Take 2 capsules (1,000 mg total) by mouth 2 (two) times daily. 02/19/18   Gilda Crease, MD  predniSONE (DELTASONE) 20 MG tablet 3 tabs po daily x 3 days, then 2 tabs x 3 days, then 1.5 tabs x 3 days, then 1 tab x 3 days, then 0.5 tabs x 3 days 02/19/18   Gilda Crease, MD    Family History No family history on file.  Social History Social History   Tobacco Use  . Smoking status: Former Smoker    Types:  Cigarettes    Last attempt to quit: 01/19/2018    Years since quitting: 0.1  . Smokeless tobacco: Never Used  Substance Use Topics  . Alcohol use: Yes  . Drug use: Yes    Types: Marijuana     Allergies   Patient has no known allergies.   Review of Systems Review of Systems  All other systems reviewed and are negative.    Physical Exam Updated Vital Signs BP 138/87   Pulse 80   Temp 98.3 F (36.8 C) (Oral)   Resp (!) 22   Ht 5\' 9"  (1.753 m)   Wt 117.9 kg   SpO2 96%   BMI 38.40 kg/m   Physical Exam  Constitutional: He is oriented to person, place, and time. He appears well-developed and well-nourished. No distress.  HENT:  Head: Normocephalic and atraumatic.  Eyes: Pupils are equal, round, and reactive to light. Conjunctivae and EOM are normal. Right eye exhibits no discharge. Left eye exhibits no discharge. No scleral icterus.  Neck: Normal range of motion. Neck supple. No JVD present. No tracheal deviation present.  Cardiovascular: Normal rate, regular rhythm and normal heart sounds. Exam reveals no gallop and no friction rub.  No murmur heard. Pulmonary/Chest: Effort normal and breath sounds normal. No respiratory distress. He  has no wheezes. He has no rales. He exhibits no tenderness.  Abdominal: Soft. He exhibits no distension and no mass. There is no tenderness. There is no rebound and no guarding.  Musculoskeletal: Normal range of motion. He exhibits no edema or tenderness.  Neurological: He is alert and oriented to person, place, and time.  Skin: Skin is warm and dry. He is not diaphoretic.  Psychiatric: He has a normal mood and affect. His behavior is normal. Judgment and thought content normal.  Nursing note and vitals reviewed.    ED Treatments / Results  Labs (all labs ordered are listed, but only abnormal results are displayed) Labs Reviewed  BASIC METABOLIC PANEL - Abnormal; Notable for the following components:      Result Value   Potassium 3.4 (*)     Glucose, Bld 151 (*)    All other components within normal limits  CBC  I-STAT TROPONIN, ED  I-STAT TROPONIN, ED    EKG EKG Interpretation  Date/Time:  Sunday March 30 2018 22:04:26 EDT Ventricular Rate:  80 PR Interval:  154 QRS Duration: 88 QT Interval:  346 QTC Calculation: 399 R Axis:   -5 Text Interpretation:  Normal sinus rhythm Left ventricular hypertrophy Abnormal ECG No significant change since last tracing Confirmed by Shaune PollackIsaacs, Cameron [phone]([id number]) on 03/30/2018 10:59:21 PM   Radiology Dg Chest 2 View  Result Date: 03/30/2018 CLINICAL DATA:  Chest pain EXAM: CHEST - 2 VIEW COMPARISON:  02/18/2018 FINDINGS: Mild diffuse coarse interstitial opacity, likely chronic change. No acute consolidation or pleural effusion. Normal heart size. No pneumothorax. IMPRESSION: No active cardiopulmonary disease. Electronically Signed   By: Jasmine PangKim  Fujinaga M.D.   On: 03/30/2018 22:45    Procedures Procedures (including critical care time)  Medications Ordered in ED Medications  gi cocktail (Maalox,Lidocaine,Donnatal) (has no administration in time range)     Initial Impression / Assessment and Plan / ED Course  I have reviewed the triage vital signs and the nursing notes.  Pertinent labs & imaging results that were available during my care of the patient were reviewed by me and considered in my medical decision making (see chart for details).     Patient with chest pain and shortness of breath x2 to 3 days.  Initial troponin is negative.  No acute ischemic changes on EKG.  Patient states that he feels well now, but is concerned about the persistent nature of his symptoms.  This is his fourth visit for the same.  His heart score is 1.  I do not feel the patient needs to be admitted to the hospital, but I would like for him to have close follow-up with a PCP.  He may need to have a stress test or echocardiogram.  Will consult case management to see if they are able to assist him in getting  into a PCP, as he has no insurance, and has severe lack of follow-up.  Recent labs and charts reviewed.  Recent negative d-dimer.  Doubt PE.  He is not hypoxic nor tachycardic.  He is not wheezing on exam tonight, but he does report significant improvement of his symptoms with breathing treatments.  He reports being out of his albuterol, I will refill this for him.  Patient discussed with Dr. Erma HeritageIsaacs, who agrees with this plan.  He also recommends repeat troponin.  If negative, will plan for discharge.  [facility]  Final Clinical Impressions(s) / ED Diagnoses   Final diagnoses:  Chest pain, unspecified type    ED Discharge Orders         Ordered    albuterol (PROVENTIL) (2.5 MG/3ML) 0.083% nebulizer solution  Every 6 hours PRN,   Status:  Discontinued     03/31/18 0131    albuterol (PROVENTIL) (2.5 MG/3ML) 0.083% nebulizer solution  Every 6 hours PRN     03/31/18 0131           Roxy HorsemanBrowning, Selicia Windom, PA-C 03/31/18 0426    Shaune PollackIsaacs, Cameron, MD 03/31/18 [phone]

## 2018-03-30 NOTE — ED Triage Notes (Signed)
C/o sob and dull pain to center of chest x 2-3 days. Denies nausea and vomiting.

## 2018-03-30 NOTE — ED Notes (Signed)
ED Provider at bedside. 

## 2018-03-31 LAB — I-STAT TROPONIN, ED: Troponin i, poc: 0 ng/mL (ref 0.00–0.08)

## 2018-03-31 MED ORDER — ALBUTEROL SULFATE (2.5 MG/3ML) 0.083% IN NEBU: 2.5000 mg | INHALATION_SOLUTION | Freq: Four times a day (QID) | RESPIRATORY_TRACT | 12 refills | Status: DC | PRN

## 2018-03-31 NOTE — Care Management Note (Addendum)
Case Management Note  CM consulted for no pcp and no ins with need for follow up.  CM noted some information provided to pt at time of D/C from the ED.  CM called pt's home phone number and left a HIPAA compliant message for pt or mother to return call to provide other PCP office information.  Boykin ReaperUpdated Browning, PA via messages.    04/01/2018 11:45 Pt's mother returned CM call.  Pt lives with his brother in [place].  [facility], and information on pharmacy and financial counseling.  No further CM needs noted at this time.  Marquail Bradwell, Lynnae SandhoffAngela N, RN 03/31/2018, 10:31 AM

## 2018-04-15 ENCOUNTER — Ambulatory Visit (INDEPENDENT_AMBULATORY_CARE_PROVIDER_SITE_OTHER): Payer: Self-pay | Admitting: Family Medicine

## 2018-04-15 ENCOUNTER — Encounter: Payer: Self-pay | Admitting: Family Medicine

## 2018-04-15 VITALS — BP 126/82 | HR 84 | Temp 98.0°F | Ht 69.0 in | Wt 264.0 lb

## 2018-04-15 DIAGNOSIS — Z7689 Persons encountering health services in other specified circumstances: Secondary | ICD-10-CM

## 2018-04-15 DIAGNOSIS — Z09 Encounter for follow-up examination after completed treatment for conditions other than malignant neoplasm: Secondary | ICD-10-CM

## 2018-04-15 DIAGNOSIS — Z87891 Personal history of nicotine dependence: Secondary | ICD-10-CM

## 2018-04-15 DIAGNOSIS — Z6838 Body mass index (BMI) 38.0-38.9, adult: Secondary | ICD-10-CM

## 2018-04-15 DIAGNOSIS — Z131 Encounter for screening for diabetes mellitus: Secondary | ICD-10-CM

## 2018-04-15 DIAGNOSIS — J3089 Other allergic rhinitis: Secondary | ICD-10-CM

## 2018-04-15 DIAGNOSIS — L299 Pruritus, unspecified: Secondary | ICD-10-CM

## 2018-04-15 DIAGNOSIS — E66812 Obesity, class 2: Secondary | ICD-10-CM

## 2018-04-15 DIAGNOSIS — J4520 Mild intermittent asthma, uncomplicated: Secondary | ICD-10-CM

## 2018-04-15 LAB — POCT URINALYSIS DIP (MANUAL ENTRY)
Bilirubin, UA: NEGATIVE
Blood, UA: NEGATIVE
Glucose, UA: NEGATIVE mg/dL
Ketones, POC UA: NEGATIVE mg/dL
Leukocytes, UA: NEGATIVE
Nitrite, UA: NEGATIVE
Protein Ur, POC: NEGATIVE mg/dL
Spec Grav, UA: 1.015 (ref 1.010–1.025)
Urobilinogen, UA: 0.2 E.U./dL
pH, UA: 6 (ref 5.0–8.0)

## 2018-04-15 LAB — POCT GLYCOSYLATED HEMOGLOBIN (HGB A1C): Hemoglobin A1C: 5.8 % — AB (ref 4.0–5.6)

## 2018-04-15 MED ORDER — BUDESONIDE-FORMOTEROL FUMARATE 80-4.5 MCG/ACT IN AERO: 2.0000 | INHALATION_SPRAY | Freq: Two times a day (BID) | RESPIRATORY_TRACT | 6 refills | Status: DC

## 2018-04-15 MED ORDER — ALBUTEROL SULFATE (2.5 MG/3ML) 0.083% IN NEBU: 2.5000 mg | INHALATION_SOLUTION | Freq: Four times a day (QID) | RESPIRATORY_TRACT | 12 refills | Status: DC | PRN

## 2018-04-15 MED ORDER — ALBUTEROL SULFATE HFA 108 (90 BASE) MCG/ACT IN AERS: 1.0000 | INHALATION_SPRAY | Freq: Four times a day (QID) | RESPIRATORY_TRACT | 11 refills | Status: DC | PRN

## 2018-04-15 MED ORDER — CETIRIZINE HCL 10 MG PO TABS: 10.0000 mg | ORAL_TABLET | Freq: Every day | ORAL | 11 refills | Status: DC

## 2018-04-15 MED ORDER — HYDROCORTISONE 0.5 % EX CREA: 1.0000 "application " | TOPICAL_CREAM | Freq: Two times a day (BID) | CUTANEOUS | 3 refills | Status: DC

## 2018-04-15 MED ORDER — FLUTICASONE PROPIONATE 50 MCG/ACT NA SUSP: 2.0000 | Freq: Every day | NASAL | 6 refills | Status: DC

## 2018-04-15 MED FILL — SYMBICORT 80-4.5 MCG INH: 80-4.5 | 30 days supply | Qty: 10 | Fill #0

## 2018-04-15 MED FILL — FLUTICASONE PROP 50 MCG SPR: 50 | 30 days supply | Qty: 16 | Fill #0

## 2018-04-15 MED FILL — !VENTOLIN HFA INHALER: 108 (90 BAS | 25 days supply | Qty: 18 | Fill #0

## 2018-04-15 MED FILL — ALBUTEROL SUL 2.5 MG/3 ML S: (2.5 MG/3ML | 6 days supply | Qty: 75 | Fill #0

## 2018-04-15 NOTE — Patient Instructions (Addendum)
Cetirizine tablets What is this medicine? CETIRIZINE (se TI ra zeen) is an antihistamine. This medicine is used to treat or prevent symptoms of allergies. It is also used to help reduce itchy skin rash and hives. This medicine may be used for other purposes; ask your health care provider or pharmacist if you have questions. COMMON BRAND NAME(S): All Day Allergy, Zyrtec, Zyrtec Hives Relief What should I tell my health care provider before I take this medicine? They need to know if you have any of these conditions: -kidney disease -liver disease -an unusual or allergic reaction to cetirizine, hydroxyzine, other medicines, foods, dyes, or preservatives -pregnant or trying to get pregnant -breast-feeding How should I use this medicine? Take this medicine by mouth with a glass of water. Follow the directions on the prescription label. You can take this medicine with food or on an empty stomach. Take your medicine at regular times. Do not take more often than directed. You may need to take this medicine for several days before your symptoms improve. Talk to your pediatrician regarding the use of this medicine in children. Special care may be needed. While this drug may be prescribed for children as young as 50 years of age for selected conditions, precautions do apply. Overdosage: If you think you have taken too much of this medicine contact a poison control center or emergency room at once. NOTE: This medicine is only for you. Do not share this medicine with others. What if I miss a dose? If you miss a dose, take it as soon as you can. If it is almost time for your next dose, take only that dose. Do not take double or extra doses. What may interact with this medicine? -alcohol -certain medicines for anxiety or sleep -narcotic medicines for pain -other medicines for colds or allergies This list may not describe all possible interactions. Give your health care provider a list of all the medicines,  herbs, non-prescription drugs, or dietary supplements you use. Also tell them if you smoke, drink alcohol, or use illegal drugs. Some items may interact with your medicine. What should I watch for while using this medicine? Visit your doctor or health care professional for regular checks on your health. Tell your doctor if your symptoms do not improve. You may get drowsy or dizzy. Do not drive, use machinery, or do anything that needs mental alertness until you know how this medicine affects you. Do not stand or sit up quickly, especially if you are an older patient. This reduces the risk of dizzy or fainting spells. Your mouth may get dry. Chewing sugarless gum or sucking hard candy, and drinking plenty of water may help. Contact your doctor if the problem does not go away or is severe. What side effects may I notice from receiving this medicine? Side effects that you should report to your doctor or health care professional as soon as possible: -allergic reactions like skin rash, itching or hives, swelling of the face, lips, or tongue -changes in vision or hearing -fast or irregular heartbeat -trouble passing urine or change in the amount of urine Side effects that usually do not require medical attention (report to your doctor or health care professional if they continue or are bothersome): -dizziness -dry mouth -irritability -sore throat -stomach pain -tiredness This list may not describe all possible side effects. Call your doctor for medical advice about side effects. You may report side effects to FDA at 1-800-FDA-1088. Where should I keep my medicine? Keep out of  the reach of children. Store at room temperature between 15 and 30 degrees C (59 and 86 degrees F). Throw away any unused medicine after the expiration date. NOTE: This sheet is a summary. It may not cover all possible information. If you have questions about this medicine, talk to your doctor, pharmacist, or health care  provider.  2018 Elsevier/Gold Standard (2014-08-24 13:44:42)      Heart-Healthy Eating Plan Heart-healthy meal planning includes:  Limiting unhealthy fats.  Increasing healthy fats.  Making other small dietary changes.  You may need to talk with your doctor or a diet specialist (dietitian) to create an eating plan that is right for you. What types of fat should I choose?  Choose healthy fats. These include olive oil and canola oil, flaxseeds, walnuts, almonds, and seeds.  Eat more omega-3 fats. These include salmon, mackerel, sardines, tuna, flaxseed oil, and ground flaxseeds. Try to eat fish at least twice each week.  Limit saturated fats. ? Saturated fats are often found in animal products, such as meats, butter, and cream. ? Plant sources of saturated fats include palm oil, palm kernel oil, and coconut oil.  Avoid foods with partially hydrogenated oils in them. These include stick margarine, some tub margarines, cookies, crackers, and other baked goods. These contain trans fats. What general guidelines do I need to follow?  Check food labels carefully. Identify foods with trans fats or high amounts of saturated fat.  Fill one half of your plate with vegetables and green salads. Eat 4-5 servings of vegetables per day. A serving of vegetables is: ? 1 cup of raw leafy vegetables. ?  cup of raw or cooked cut-up vegetables. ?  cup of vegetable juice.  Fill one fourth of your plate with whole grains. Look for the word "whole" as the first word in the ingredient list.  Fill one fourth of your plate with lean protein foods.  Eat 4-5 servings of fruit per day. A serving of fruit is: ? One medium whole fruit. ?  cup of dried fruit. ?  cup of fresh, frozen, or canned fruit. ?  cup of 100% fruit juice.  Eat more foods that contain soluble fiber. These include apples, broccoli, carrots, beans, peas, and barley. Try to get 20-30 g of fiber per day.  Eat more home-cooked  food. Eat less restaurant, buffet, and fast food.  Limit or avoid alcohol.  Limit foods high in starch and sugar.  Avoid fried foods.  Avoid frying your food. Try baking, boiling, grilling, or broiling it instead. You can also reduce fat by: ? Removing the skin from poultry. ? Removing all visible fats from meats. ? Skimming the fat off of stews, soups, and gravies before serving them. ? Steaming vegetables in water or broth.  Lose weight if you are overweight.  Eat 4-5 servings of nuts, legumes, and seeds per week: ? One serving of dried beans or legumes equals  cup after being cooked. ? One serving of nuts equals 1 ounces. ? One serving of seeds equals  ounce or one tablespoon.  You may need to keep track of how much salt or sodium you eat. This is especially true if you have high blood pressure. Talk with your doctor or dietitian to get more information. What foods can I eat? Grains Breads, including [place], white, pita, wheat, raisin, rye, oatmeal, and [place]. Tortillas that are neither fried nor made with lard or trans fat. Low-fat rolls, including hotdog and hamburger buns and English muffins. Biscuits.  Muffins. Waffles. Pancakes. Light popcorn. Whole-grain cereals. Flatbread. Melba toast. Pretzels. Breadsticks. Rusks. Low-fat snacks. Low-fat crackers, including oyster, saltine, matzo, graham, animal, and rye. Rice and pasta, including brown rice and pastas that are made with whole wheat. Vegetables All vegetables. Fruits All fruits, but limit coconut. Meats and Other Protein Sources Lean, well-trimmed beef, veal, pork, and lamb. Chicken and [place] without skin. All fish and shellfish. Wild duck, rabbit, pheasant, and venison. Egg whites or low-cholesterol egg substitutes. Dried beans, peas, lentils, and tofu. Seeds and most nuts. Dairy Low-fat or nonfat cheeses, including ricotta, string, and mozzarella. Skim or 1% milk that is liquid, powdered, or evaporated. Buttermilk  that is made with low-fat milk. Nonfat or low-fat yogurt. Beverages Mineral water. Diet carbonated beverages. Sweets and Desserts Sherbets and fruit ices. Honey, jam, marmalade, jelly, and syrups. Meringues and gelatins. Pure sugar candy, such as hard candy, jelly beans, gumdrops, mints, marshmallows, and small amounts of dark chocolate. [employer] Eat all sweets and desserts in moderation. Fats and Oils Nonhydrogenated (trans-free) margarines. Vegetable oils, including soybean, sesame, sunflower, olive, peanut, safflower, corn, canola, and cottonseed. Salad dressings or mayonnaise made with a vegetable oil. Limit added fats and oils that you use for cooking, baking, salads, and as spreads. Other Cocoa powder. Coffee and tea. All seasonings and condiments. The items listed above may not be a complete list of recommended foods or beverages. Contact your dietitian for more options. What foods are not recommended? Grains Breads that are made with saturated or trans fats, oils, or whole milk. Croissants. Butter rolls. Cheese breads. Sweet rolls. Donuts. Buttered popcorn. Chow mein noodles. High-fat crackers, such as cheese or butter crackers. Meats and Other Protein Sources Fatty meats, such as hotdogs, short ribs, sausage, spareribs, bacon, rib eye roast or steak, and mutton. High-fat deli meats, such as salami and bologna. Caviar. Domestic duck and goose. Organ meats, such as kidney, liver, sweetbreads, and heart. Dairy Cream, sour cream, cream cheese, and creamed cottage cheese. Whole-milk cheeses, including blue (bleu), Monte[place] Jack, [place], [place], American, [place], Swiss, cheddar, [place], and [place]. Whole or 2% milk that is liquid, evaporated, or condensed. Whole buttermilk. Cream sauce or high-fat cheese sauce. Yogurt that is made from whole milk. Beverages Regular sodas and juice drinks with added sugar. Sweets and Desserts Frosting. Pudding. Cookies. Cakes other than angel food  cake. Candy that has milk chocolate or white chocolate, hydrogenated fat, butter, coconut, or unknown ingredients. Buttered syrups. Full-fat ice cream or ice cream drinks. Fats and Oils Gravy that has suet, meat fat, or shortening. Cocoa butter, hydrogenated oils, palm oil, coconut oil, palm kernel oil. These can often be found in baked products, candy, fried foods, nondairy creamers, and whipped toppings. Solid fats and shortenings, including bacon fat, salt pork, lard, and butter. Nondairy cream substitutes, such as coffee creamers and sour cream substitutes. Salad dressings that are made of unknown oils, cheese, or sour cream. The items listed above may not be a complete list of foods and beverages to avoid. Contact your dietitian for more information. This information is not intended to replace advice given to you by your health care provider. Make sure you discuss any questions you have with your health care provider. Document Released: 01/29/2012 Document Revised: 01/05/2016 Document Reviewed: 01/21/2014 Elsevier Interactive Patient Education  2018 [place] Eating Plan DASH stands for "Dietary Approaches to Stop Hypertension." The DASH eating plan is a healthy eating plan that has been shown to reduce high blood pressure (hypertension).  It may also reduce your risk for type 2 diabetes, heart disease, and stroke. The DASH eating plan may also help with weight loss. What are tips for following this plan? General guidelines  Avoid eating more than 2,300 mg (milligrams) of salt (sodium) a day. If you have hypertension, you may need to reduce your sodium intake to 1,500 mg a day.  Limit alcohol intake to no more than 1 drink a day for nonpregnant women and 2 drinks a day for men. One drink equals 12 oz of beer, 5 oz of wine, or 1 oz of hard liquor.  Work with your health care provider to maintain a healthy body weight or to lose weight. Ask what an ideal weight is for you.  Get at  least 30 minutes of exercise that causes your heart to beat faster (aerobic exercise) most days of the week. Activities may include walking, swimming, or biking.  Work with your health care provider or diet and nutrition specialist (dietitian) to adjust your eating plan to your individual calorie needs. Reading food labels  Check food labels for the amount of sodium per serving. Choose foods with less than 5 percent of the Daily Value of sodium. Generally, foods with less than 300 mg of sodium per serving fit into this eating plan.  To find whole grains, look for the word "whole" as the first word in the ingredient list. Shopping  Buy products labeled as "low-sodium" or "no salt added."  Buy fresh foods. Avoid canned foods and premade or frozen meals. Cooking  Avoid adding salt when cooking. Use salt-free seasonings or herbs instead of table salt or sea salt. Check with your health care provider or pharmacist before using salt substitutes.  Do not fry foods. Cook foods using healthy methods such as baking, boiling, grilling, and broiling instead.  Cook with heart-healthy oils, such as olive, canola, soybean, or sunflower oil. Meal planning   Eat a balanced diet that includes: ? 5 or more servings of fruits and vegetables each day. At each meal, try to fill half of your plate with fruits and vegetables. ? Up to 6-8 servings of whole grains each day. ? Less than 6 oz of lean meat, poultry, or fish each day. A 3-oz serving of meat is about the same size as a deck of cards. One egg equals 1 oz. ? 2 servings of low-fat dairy each day. ? A serving of nuts, seeds, or beans 5 times each week. ? Heart-healthy fats. Healthy fats called Omega-3 fatty acids are found in foods such as flaxseeds and coldwater fish, like sardines, salmon, and mackerel.  Limit how much you eat of the following: ? Canned or prepackaged foods. ? Food that is high in trans fat, such as fried foods. ? Food that is high  in saturated fat, such as fatty meat. ? Sweets, desserts, sugary drinks, and other foods with added sugar. ? Full-fat dairy products.  Do not salt foods before eating.  Try to eat at least 2 vegetarian meals each week.  Eat more home-cooked food and less restaurant, buffet, and fast food.  When eating at a restaurant, ask that your food be prepared with less salt or no salt, if possible. What foods are recommended? The items listed may not be a complete list. Talk with your dietitian about what dietary choices are best for you. Grains Whole-grain or whole-wheat bread. Whole-grain or whole-wheat pasta. Brown rice. [place]. Bulgur. Whole-grain and low-sodium cereals. Pita bread. Low-fat, low-sodium crackers.  Whole-wheat flour tortillas. Vegetables Fresh or frozen vegetables (raw, steamed, roasted, or grilled). Low-sodium or reduced-sodium tomato and vegetable juice. Low-sodium or reduced-sodium tomato sauce and tomato paste. Low-sodium or reduced-sodium canned vegetables. Fruits All fresh, dried, or frozen fruit. Canned fruit in natural juice (without added sugar). Meat and other protein foods Skinless chicken or [place]. Ground chicken or [place]. Pork with fat trimmed off. Fish and seafood. Egg whites. Dried beans, peas, or lentils. Unsalted nuts, nut butters, and seeds. Unsalted canned beans. Lean cuts of beef with fat trimmed off. Low-sodium, lean deli meat. Dairy Low-fat (1%) or fat-free (skim) milk. Fat-free, low-fat, or reduced-fat cheeses. Nonfat, low-sodium ricotta or cottage cheese. Low-fat or nonfat yogurt. Low-fat, low-sodium cheese. Fats and oils Soft margarine without trans fats. Vegetable oil. Low-fat, reduced-fat, or light mayonnaise and salad dressings (reduced-sodium). Canola, safflower, olive, soybean, and sunflower oils. Avocado. Seasoning and other foods Herbs. Spices. Seasoning mixes without salt. Unsalted popcorn and pretzels. Fat-free sweets. What foods are not  recommended? The items listed may not be a complete list. Talk with your dietitian about what dietary choices are best for you. Grains Baked goods made with fat, such as croissants, muffins, or some breads. Dry pasta or rice meal packs. Vegetables Creamed or fried vegetables. Vegetables in a cheese sauce. Regular canned vegetables (not low-sodium or reduced-sodium). Regular canned tomato sauce and paste (not low-sodium or reduced-sodium). Regular tomato and vegetable juice (not low-sodium or reduced-sodium). Angie Fava. Olives. Fruits Canned fruit in a light or heavy syrup. Fried fruit. Fruit in cream or butter sauce. Meat and other protein foods Fatty cuts of meat. Ribs. Fried meat. Bernie[place] Salines. Sausage. Bologna and other processed lunch meats. Salami. Fatback. Hotdogs. Bratwurst. Salted nuts and seeds. Canned beans with added salt. Canned or smoked fish. Whole eggs or egg yolks. Chicken or [place] with skin. Dairy Whole or 2% milk, cream, and half-and-half. Whole or full-fat cream cheese. Whole-fat or sweetened yogurt. Full-fat cheese. Nondairy creamers. Whipped toppings. Processed cheese and cheese spreads. Fats and oils Butter. Stick margarine. Lard. Shortening. Ghee. Bacon fat. Tropical oils, such as coconut, palm kernel, or palm oil. Seasoning and other foods Salted popcorn and pretzels. Onion salt, garlic salt, seasoned salt, table salt, and sea salt. Worcestershire sauce. Tartar sauce. Barbecue sauce. Teriyaki sauce. Soy sauce, including reduced-sodium. Steak sauce. Canned and packaged gravies. Fish sauce. Oyster sauce. Cocktail sauce. Horseradish that you find on the shelf. Ketchup. Mustard. Meat flavorings and tenderizers. Bouillon cubes. Hot sauce and Tabasco sauce. Premade or packaged marinades. Premade or packaged taco seasonings. Relishes. Regular salad dressings. Where to find more information:  National Heart, Lung, and [place]: https://wilson-eaton.com/  American Heart Association:  www.heart.org Summary  The DASH eating plan is a healthy eating plan that has been shown to reduce high blood pressure (hypertension). It may also reduce your risk for type 2 diabetes, heart disease, and stroke.  With the DASH eating plan, you should limit salt (sodium) intake to 2,300 mg a day. If you have hypertension, you may need to reduce your sodium intake to 1,500 mg a day.  When on the DASH eating plan, aim to eat more fresh fruits and vegetables, whole grains, lean proteins, low-fat dairy, and heart-healthy fats.  Work with your health care provider or diet and nutrition specialist (dietitian) to adjust your eating plan to your individual calorie needs. This information is not intended to replace advice given to you by your health care provider. Make sure you discuss any questions you have with your health  care provider. Document Released: 07/19/2011 Document Revised: 07/23/2016 Document Reviewed: 07/23/2016 Elsevier Interactive Patient Education  2018 [employer].  Albuterol inhalation solution What is this medicine? ALBUTEROL (al Norm[place]ean [place]) is a bronchodilator. It helps to open up the airways in your lungs to make it easier to breathe. This medicine is used to treat and to prevent bronchospasm. This medicine may be used for other purposes; ask your health care provider or pharmacist if you have questions. COMMON BRAND NAME(S): Accuneb, Proventil What should I tell my health care provider before I take this medicine? They need to know if you have any of the following conditions: -diabetes -heart disease or irregular heartbeat -high blood pressure -pheochromocytoma -seizures -thyroid disease -an unusual or allergic reaction to albuterol, levalbuterol, sulfites, other medicines, foods, dyes, or preservatives -pregnant or trying to get pregnant -breast-feeding How should I use this medicine? This medicine is used in a nebulizer. Nebulizers make a liquid into an aerosol that  you breathe in through your mouth or your mouth and nose into your lungs. You will be taught how to use your nebulizer. Follow the directions on your prescription label. Take your medicine at regular intervals. Do not use more often than directed. Talk to your pediatrician regarding the use of this medicine in children. Special care may be needed. Overdosage: If you think you have taken too much of this medicine contact a poison control center or emergency room at once. NOTE: This medicine is only for you. Do not share this medicine with others. What if I miss a dose? If you miss a dose, use it as soon as you can. If it is almost time for your next dose, use only that dose. Do not use double or extra doses. What may interact with this medicine? -anti-infectives like chloroquine and pentamidine -caffeine -cisapride -diuretics -medicines for colds -medicines for depression or emotional or psychotic conditions -medicines for weight loss including some herbal products -methadone -some antibiotics like clarithromycin, erythromycin, levofloxacin, and linezolid -some heart medicines -steroid hormones like dexamethasone, cortisone, hydrocortisone -theophylline -thyroid hormones This list may not describe all possible interactions. Give your health care provider a list of all the medicines, herbs, non-prescription drugs, or dietary supplements you use. Also tell them if you smoke, drink alcohol, or use illegal drugs. Some items may interact with your medicine. What should I watch for while using this medicine? Tell your doctor or health care professional if your symptoms do not improve. Do not use extra albuterol. Call your doctor right away if your asthma or bronchitis gets worse while you are using this medicine. If your mouth gets dry try chewing sugarless gum or sucking hard candy. Drink water as directed. What side effects may I notice from receiving this medicine? Side effects that you should  report to your doctor or health care professional as soon as possible: -allergic reactions like skin rash, itching or hives, swelling of the face, lips, or tongue -breathing problems -chest pain -feeling faint or lightheaded, falls -high blood pressure -irregular heartbeat -fever -muscle cramps or weakness -pain, tingling, numbness in the hands or feet -vomiting Side effects that usually do not require medical attention (report to your doctor or health care professional if they continue or are bothersome): -cough -difficulty sleeping -headache -nervousness, trembling -stomach upset -stuffy or runny nose -throat irritation -unusual taste This list may not describe all possible side effects. Call your doctor for medical advice about side effects. You may report side effects to FDA at 1-800-FDA-1088.  Where should I keep my medicine? Keep out of the reach of children. Store between 2 and 25 degrees C (36 and 77 degrees F). Do not freeze. Protect from light. Throw away any unused medicine after the expiration date. Most products are kept in the foil package until time of use. Some products can be used up to 1 week after they are removed from the foil pouch. Check the instructions that come with your medicine. NOTE: This sheet is a summary. It may not cover all possible information. If you have questions about this medicine, talk to your doctor, pharmacist, or health care provider.  2018 Elsevier/Gold Standard (2011-04-20 15:19:55) Albuterol inhalation aerosol What is this medicine? ALBUTEROL (al Normajean [place]) is a bronchodilator. It helps open up the airways in your lungs to make it easier to breathe. This medicine is used to treat and to prevent bronchospasm. This medicine may be used for other purposes; ask your health care provider or pharmacist if you have questions. COMMON BRAND NAME(S): Proair HFA, Proventil, Proventil HFA, Respirol, Ventolin, Ventolin HFA What should I tell my  health care provider before I take this medicine? They need to know if you have any of the following conditions: -diabetes -heart disease or irregular heartbeat -high blood pressure -pheochromocytoma -seizures -thyroid disease -an unusual or allergic reaction to albuterol, levalbuterol, sulfites, other medicines, foods, dyes, or preservatives -pregnant or trying to get pregnant -breast-feeding How should I use this medicine? This medicine is for inhalation through the mouth. Follow the directions on your prescription label. Take your medicine at regular intervals. Do not use more often than directed. Make sure that you are using your inhaler correctly. Ask you doctor or health care provider if you have any questions. Talk to your pediatrician regarding the use of this medicine in children. Special care may be needed. Overdosage: If you think you have taken too much of this medicine contact a poison control center or emergency room at once. NOTE: This medicine is only for you. Do not share this medicine with others. What if I miss a dose? If you miss a dose, use it as soon as you can. If it is almost time for your next dose, use only that dose. Do not use double or extra doses. What may interact with this medicine? -anti-infectives like chloroquine and pentamidine -caffeine -cisapride -diuretics -medicines for colds -medicines for depression or for emotional or psychotic conditions -medicines for weight loss including some herbal products -methadone -some antibiotics like clarithromycin, erythromycin, levofloxacin, and linezolid -some heart medicines -steroid hormones like dexamethasone, cortisone, hydrocortisone -theophylline -thyroid hormones This list may not describe all possible interactions. Give your health care provider a list of all the medicines, herbs, non-prescription drugs, or dietary supplements you use. Also tell them if you smoke, drink alcohol, or use illegal drugs.  Some items may interact with your medicine. What should I watch for while using this medicine? Tell your doctor or health care professional if your symptoms do not improve. Do not use extra albuterol. If your asthma or bronchitis gets worse while you are using this medicine, call your doctor right away. If your mouth gets dry try chewing sugarless gum or sucking hard candy. Drink water as directed. What side effects may I notice from receiving this medicine? Side effects that you should report to your doctor or health care professional as soon as possible: -allergic reactions like skin rash, itching or hives, swelling of the face, lips, or tongue -breathing problems -chest pain -  feeling faint or lightheaded, falls -high blood pressure -irregular heartbeat -fever -muscle cramps or weakness -pain, tingling, numbness in the hands or feet -vomiting Side effects that usually do not require medical attention (report to your doctor or health care professional if they continue or are bothersome): -cough -difficulty sleeping -headache -nervousness or trembling -stomach upset -stuffy or runny nose -throat irritation -unusual taste This list may not describe all possible side effects. Call your doctor for medical advice about side effects. You may report side effects to FDA at 1-800-FDA-1088. Where should I keep my medicine? Keep out of the reach of children. Store at room temperature between 15 and 30 degrees C (59 and 86 degrees F). The contents are under pressure and may burst when exposed to heat or flame. Do not freeze. This medicine does not work as well if it is too cold. Throw away any unused medicine after the expiration date. Inhalers need to be thrown away after the labeled number of puffs have been used or by the expiration date; whichever comes first. Ventolin HFA should be thrown away 12 months after removing from foil pouch. Check the instructions that come with your medicine. NOTE:  This sheet is a summary. It may not cover all possible information. If you have questions about this medicine, talk to your doctor, pharmacist, or health care provider.  2018 Elsevier/Gold Standard (2013-01-15 10:57:17) Budesonide; Formoterol Inhalation What is this medicine? BUDESONIDE; FORMOTEROL (byoo DES oh nide; for Jasper te rol) inhalation is a combination of 2 medicines that decrease inflammation and help to open up the airways in your lungs. It is used to treat asthma. It is also used to treat chronic obstructive pulmonary disease (COPD), including chronic bronchitis or emphysema. Do NOT use for an acute asthma or COPD attack. This medicine may be used for other purposes; ask your health care provider or pharmacist if you have questions. COMMON BRAND NAME(S): Symbicort What should I tell my health care provider before I take this medicine? They need to know if you have any of these conditions: -bone problems -diabetes -heart disease or irregular heartbeat -high blood pressure -immune system problems -infection -liver disease -worsening asthma -an unusual or allergic reaction to budesonide, formoterol, medicines, foods, dyes, or preservatives -pregnant or trying to get pregnant -breast-feeding How should I use this medicine? This medicine is inhaled through the mouth. Rinse your mouth with water after use. Make sure not to swallow the water. Follow the directions on your prescription label. Do not use more often than directed. Do not stop taking except on your doctor's advice. Make sure that you are using your inhaler correctly. Ask your doctor or health care provider if you have any questions. A special MedGuide will be given to you by the pharmacist with each prescription and refill. Be sure to read this information carefully each time. Talk to your pediatrician regarding the use of this medicine in children. While this drug may be prescribed for children as young as 62 years of age for  selected conditions, precautions do apply. Overdosage: If you think you have taken too much of this medicine contact a poison control center or emergency room at once. NOTE: This medicine is only for you. Do not share this medicine with others. What if I miss a dose? If you miss a dose, use it as soon as you remember. If it is almost time for your next dose, use only that dose and continue with your regular schedule, spacing doses evenly. Do not  use double or extra doses. What may interact with this medicine? Do not take this medicine with any of the following medications: -MAOIs like Carbex, Eldepryl, Marplan, Nardil, and Parnate -mifepristone -probucol -procarbazine -some other medicines for asthma like formoterol, salmeterol This medicine may also interact with the following medications: -antibiotics like clarithromycin, erythromycin -cimetidine -diuretics -grapefruit juice -itraconazole -ketoconazole -medicines for depression, anxiety, or psychotic disturbances -medicines for irregular heartbeat -methadone -some heart medicines like atenolol, metoprolol -some other medicines for breathing problems -some vaccines This list may not describe all possible interactions. Give your health care provider a list of all the medicines, herbs, non-prescription drugs, or dietary supplements you use. Also tell them if you smoke, drink alcohol, or use illegal drugs. Some items may interact with your medicine. What should I watch for while using this medicine? Tell your doctor or health care professional if your symptoms do not improve or get worse. Do not use this medicine more than every 12 hours. NEVER use this medicine for an acute asthma or COPD attack. You should use your short-acting rescue inhalers for this purpose. If your symptoms get worse or if you need your short-acting inhalers more often, call your doctor right away. This medicine may increase your risk of getting an infection. Tell  your doctor or health care professional if you are around anyone with measles or chickenpox, or if you develop sores or blisters that do not heal properly. What side effects may I notice from receiving this medicine? Side effects that you should report to your doctor or health care professional as soon as possible: -allergic reactions such as skin rash or itching, hives, swelling of the face, lips or tongue -breathing problems -changes in vision -chest pain -fast, irregular heartbeat -feeling faint or lightheaded, falls -fever -high blood pressure -nervousness -tremors -white patches or sores in mouth Side effects that usually do not require medical attention (report to your doctor or health care professional if they continue or are bothersome): -cough -different taste in mouth -headache -sore throat -stuffy nose -stomach upset This list may not describe all possible side effects. Call your doctor for medical advice about side effects. You may report side effects to FDA at 1-800-FDA-1088. Where should I keep my medicine? Keep out of the reach of children. Store in a dry place at room temperature between 20 and 25 degrees C (68 and 77 degrees F). Do not get the inhaler wet. Keep track of the number of doses used. Throw away the inhaler after using the marked number of inhalations or after the expiration date, whichever comes first. Do not burn or puncture canister. NOTE: This sheet is a summary. It may not cover all possible information. If you have questions about this medicine, talk to your doctor, pharmacist, or health care provider.  2018 Elsevier/Gold Standard (2016-08-02 15:25:18)   Diphenhydramine capsules or tablets What is this medicine? DIPHENHYDRAMINE (dye fen HYE dra meen) is an antihistamine. It is used to treat the symptoms of an allergic reaction. It is also used to treat Parkinson's disease. This medicine is also used to prevent and to treat motion sickness and as a  nighttime sleep aid. This medicine may be used for other purposes; ask your health care provider or pharmacist if you have questions. COMMON BRAND NAME(S): Alka-Seltzer Plus Allergy, Aller-G-Time, Banophen, Benadryl Allergy, Benadryl Allergy Dye Free, Benadryl Allergy Kapgel, Benadryl Allergy Ultratab, Diphedryl, Diphenhist, Genahist, PHARBEDRYL, Q-Dryl, Gretta Began, Valu-Dryl, Vicks ZzzQuil Nightime Sleep-Aid What should I tell my health care provider before  I take this medicine? They need to know if you have any of these conditions: -asthma or lung disease -glaucoma -high blood pressure or heart disease -liver disease -pain or difficulty passing urine -prostate trouble -ulcers or other stomach problems -an unusual or allergic reaction to diphenhydramine, other medicines foods, dyes, or preservatives such as sulfites -pregnant or trying to get pregnant -breast-feeding How should I use this medicine? Take this medicine by mouth with a full glass of water. Follow the directions on the prescription label. Take your doses at regular intervals. Do not take your medicine more often than directed. To prevent motion sickness start taking this medicine 30 to 60 minutes before you leave. Talk to your pediatrician regarding the use of this medicine in children. Special care may be needed. Patients over 4 years old may have a stronger reaction and need a smaller dose. Overdosage: If you think you have taken too much of this medicine contact a poison control center or emergency room at once. NOTE: This medicine is only for you. Do not share this medicine with others. What if I miss a dose? If you miss a dose, take it as soon as you can. If it is almost time for your next dose, take only that dose. Do not take double or extra doses. What may interact with this medicine? Do not take this medicine with any of the following medications: -MAOIs like Carbex, Eldepryl, Marplan, Nardil, and Parnate This  medicine may also interact with the following medications: -alcohol -barbiturates, like phenobarbital -medicines for bladder spasm like oxybutynin, tolterodine -medicines for blood pressure -medicines for depression, anxiety, or psychotic disturbances -medicines for movement abnormalities or Parkinson's disease -medicines for sleep -other medicines for cold, cough or allergy -some medicines for the stomach like chlordiazepoxide, dicyclomine This list may not describe all possible interactions. Give your health care provider a list of all the medicines, herbs, non-prescription drugs, or dietary supplements you use. Also tell them if you smoke, drink alcohol, or use illegal drugs. Some items may interact with your medicine. What should I watch for while using this medicine? Visit your doctor or health care professional for regular check ups. Tell your doctor if your symptoms do not improve or if they get worse. Your mouth may get dry. Chewing sugarless gum or sucking hard candy, and drinking plenty of water may help. Contact your doctor if the problem does not go away or is severe. This medicine may cause dry eyes and blurred vision. If you wear contact lenses you may feel some discomfort. Lubricating drops may help. See your eye doctor if the problem does not go away or is severe. You may get drowsy or dizzy. Do not drive, use machinery, or do anything that needs mental alertness until you know how this medicine affects you. Do not stand or sit up quickly, especially if you are an older patient. This reduces the risk of dizzy or fainting spells. Alcohol may interfere with the effect of this medicine. Avoid alcoholic drinks. What side effects may I notice from receiving this medicine? Side effects that you should report to your doctor or health care professional as soon as possible: -allergic reactions like skin rash, itching or hives, swelling of the face, lips, or tongue -changes in  vision -confused, agitated, nervous -irregular or fast heartbeat -tremor -trouble passing urine -unusual bleeding or bruising -unusually weak or tired Side effects that usually do not require medical attention (report to your doctor or health care professional if they continue  or are bothersome): -constipation, diarrhea -drowsy -headache -loss of appetite -stomach upset, vomiting -thick mucous This list may not describe all possible side effects. Call your doctor for medical advice about side effects. You may report side effects to FDA at 1-800-FDA-1088. Where should I keep my medicine? Keep out of the reach of children. Store at room temperature between 15 and 30 degrees C (59 and 86 degrees F). Keep container closed tightly. Throw away any unused medicine after the expiration date. NOTE: This sheet is a summary. It may not cover all possible information. If you have questions about this medicine, talk to your doctor, pharmacist, or health care provider.  2018 Elsevier/Gold Standard (2007-11-17 17:06:22)    Hydrocortisone skin cream, ointment, lotion, or solution What is this medicine? HYDROCORTISONE (hye droe KOR ti sone) is a corticosteroid. It is used on the skin to reduce swelling, redness, itching, and allergic reactions. This medicine may be used for other purposes; ask your health care provider or pharmacist if you have questions. COMMON BRAND NAME(S): Ala-Cort, Ala-Scalp, Anusol HC, Aqua Glycolic HC, Balneol for Her, Caldecort, Cetacort, Cortaid, Cortaid Advanced, Cortaid Intensive Therapy, Cortaid Sensitive Skin, CortAlo, Corticaine, Corticool, Cortizone, Cortizone-10, Cortizone-10 Cooling Relief, Cortizone-10 Intensive Healing, Cortizone-10 Plus, Dermarest Dricort, Dermarest Eczema, DERMASORB HC Complete, Gly-Cort, Hycort, Hydro Skin, Hydroskin, Hytone, Instacort, Lacticare HC, Locoid, Locoid Lipocream, MiCort-HC, Monistat Complete Care Instant Itch Relief Cream, Neosporin  Eczema, NuCort, Nutracort, NuZon, Pandel, Pediaderm HC, Penecort, Preparation H Hydrocortisone, Procto-Kit, Procto-Med HC, Proctocream-HC, Proctosol-HC, Proctozone-HC, Rederm, Sarnol-HC, Nurse, adult, Engineer, site, Contractor, Tucks HC, Human resources officer What should I tell my health care provider before I take this medicine? They need to know if you have any of these conditions: -any active infection -diabetes -large areas of burned or damaged skin -skin wasting or thinning -an unusual or allergic reaction to hydrocortisone, corticosteroids, sulfites, other medicines, foods, dyes, or preservatives -pregnant or trying to get pregnant -breast-feeding How should I use this medicine? This medicine is for external use only. Do not take by mouth. Follow the directions on the prescription label. Wash your hands before and after use. Apply a thin film of medicine to the affected area. Do not cover with a bandage or dressing unless your doctor or health care professional tells you to. Do not use on healthy skin or over large areas of skin. Do not get this medicine in your eyes. If you do, rinse out with plenty of cool tap water. Do not to use more medicine than prescribed. Do not use your medicine more often than directed or for more than 14 days. Talk to your pediatrician regarding the use of this medicine in children. Special care may be needed. While this drug may be prescribed for children as young as 57 years of age for selected conditions, precautions do apply. Do not use this medicine for the treatment of diaper rash unless directed to do so by your doctor or health care professional. If applying this medicine to the diaper area of a child, do not cover with tight-fitting diapers or plastic pants. This may increase the amount of medicine that passes through the skin and increase the risk of serious side effects. Elderly patients are more likely to have damaged skin through aging, and this may increase side  effects. This medicine should only be used for brief periods and infrequently in older patients. Overdosage: If you think you have taken too much of this medicine contact a poison control center or emergency room at once. NOTE: This medicine is  only for you. Do not share this medicine with others. What if I miss a dose? If you miss a dose, use it as soon as you can. If it is almost time for your next dose, use only that dose. Do not use double or extra doses. What may interact with this medicine? Interactions are not expected. Do not use any other skin products on the affected area without asking your doctor or health care professional. This list may not describe all possible interactions. Give your health care provider a list of all the medicines, herbs, non-prescription drugs, or dietary supplements you use. Also tell them if you smoke, drink alcohol, or use illegal drugs. Some items may interact with your medicine. What should I watch for while using this medicine? Tell your doctor or health care professional if your symptoms do not start to get better within 7 days or if they get worse. Tell your doctor or health care professional if you are exposed to anyone with measles or chickenpox, or if you develop sores or blisters that do not heal properly. What side effects may I notice from receiving this medicine? Side effects that you should report to your doctor or health care professional as soon as possible: -allergic reactions like skin rash, itching or hives, swelling of the face, lips, or tongue -burning feeling on the skin -dark red spots on the skin -infection -lack of healing of skin condition -painful, red, pus filled blisters in hair follicles -thinning of the skin Side effects that usually do not require medical attention (report to your doctor or health care professional if they continue or are bothersome): -dry skin, irritation -unusual increased growth of hair on the face or  body This list may not describe all possible side effects. Call your doctor for medical advice about side effects. You may report side effects to FDA at 1-800-FDA-1088. Where should I keep my medicine? Keep out of the reach of children. Store at room temperature between 15 and 30 degrees C (59 and 86 degrees F). Do not freeze. Throw away any unused medicine after the expiration date. NOTE: This sheet is a summary. It may not cover all possible information. If you have questions about this medicine, talk to your doctor, pharmacist, or health care provider.  2018 Elsevier/Gold Standard (2007-12-12 16:08:48)

## 2018-04-15 NOTE — Progress Notes (Signed)
New Patient---Establish Care  Subjective:    Patient ID: Cole Morales, male    DOB: Dec 07, 1975, 42 y.o.   MRN: [id number]   Chief Complaint  Patient presents with  . Establish Care  . Hospitalization Follow-up    HPI  Cole Morales is a 42 year old male with a past medical history of Asthma. He is here today for Hospital Follow Up.   Current Status: He is here today for follow up he is doing well with no complaints. He does have shortness of breath on exertion and occasionally when he takes a deep breath. He is accompanied today by his mother.   He continues to work on healthier eating and weight loss.   He denies fevers, chills, fatigue, recent infections, weight loss, and night sweats.   He has not had any headaches, visual changes, dizziness, and falls.   No chest pain, heart palpitations, and cough reported.   No reports of GI problems such as nausea, vomiting, diarrhea, and constipation. He has no reports of blood in stools, dysuria and hematuria. No depression or anxiety reported.   He denies pain today.   Past Medical History:  Diagnosis Date  . Asthma     Family History  Problem Relation Age of Onset  . Asthma Mother   . Cancer Maternal Grandmother   . Cancer Maternal Grandfather     Social History   Socioeconomic History  . Marital status: Single    Spouse name: Not on file  . Number of children: Not on file  . Years of education: Not on file  . Highest education level: Not on file  Occupational History  . Not on file  Social Needs  . Financial resource strain: Not on file  . Food insecurity:    Worry: Not on file    Inability: Not on file  . Transportation needs:    Medical: Not on file    Non-medical: Not on file  Tobacco Use  . Smoking status: Former Smoker    Types: Cigarettes    Last attempt to quit: 01/19/2018    Years since quitting: 0.2  . Smokeless tobacco: Never Used  Substance and Sexual Activity  . Alcohol use: Yes  . Drug use: Yes     Types: Marijuana  . Sexual activity: Not on file  Lifestyle  . Physical activity:    Days per week: Not on file    Minutes per session: Not on file  . Stress: Not on file  Relationships  . Social connections:    Talks on phone: Not on file    Gets together: Not on file    Attends religious service: Not on file    Active member of club or organization: Not on file    Attends meetings of clubs or organizations: Not on file    Relationship status: Not on file  . Intimate partner violence:    Fear of current or ex partner: Not on file    Emotionally abused: Not on file    Physically abused: Not on file    Forced sexual activity: Not on file  Other Topics Concern  . Not on file  Social History Narrative  . Not on file    Past Surgical History:  Procedure Laterality Date  . MOUTH SURGERY       There is no immunization history on file for this patient.  Current Meds  Medication Sig  . albuterol (PROVENTIL HFA;VENTOLIN HFA) 108 (90 Base) MCG/ACT inhaler Inhale  1-2 puffs into the lungs every 6 (six) hours as needed.  Marland Kitchen albuterol (PROVENTIL) (2.5 MG/3ML) 0.083% nebulizer solution Take 3 mLs (2.5 mg total) by nebulization every 6 (six) hours as needed for wheezing or shortness of breath.  . fluticasone (FLONASE) 50 MCG/ACT nasal spray Place 2 sprays into both nostrils daily.  . hydrocortisone cream 0.5 % Apply 1 application topically 2 (two) times daily.  . [DISCONTINUED] albuterol (PROVENTIL) (2.5 MG/3ML) 0.083% nebulizer solution Take 3 mLs (2.5 mg total) by nebulization every 6 (six) hours as needed for wheezing or shortness of breath.  . [DISCONTINUED] albuterol (PROVENTIL) (2.5 MG/3ML) 0.083% nebulizer solution Take 3 mLs (2.5 mg total) by nebulization every 6 (six) hours as needed for wheezing or shortness of breath.    No Known Allergies  BP 126/82 (BP Location: Left Arm, Patient Position: Sitting, Cuff Size: Large)   Pulse 84   Temp 98 F (36.7 C) (Oral)   Ht 5\' 9"   (1.753 m)   Wt 264 lb (119.7 kg)   SpO2 95%   BMI 38.99 kg/m   Review of Systems  Constitutional: Negative.   HENT: Positive for dental problem (multiple dental caries.).   Eyes: Negative.   Respiratory: Positive for shortness of breath (on exertion, extreme weather, and upon taking deep breath).   Cardiovascular: Negative.   Gastrointestinal: Positive for abdominal distention (Obese).  Endocrine: Negative.   Genitourinary: Negative.   Musculoskeletal: Negative.   Skin: Negative.   Allergic/Immunologic: Negative.   Neurological: Negative.   Hematological: Negative.   Psychiatric/Behavioral: Negative.    Objective:   Physical Exam  Constitutional: He is oriented to person, place, and time. He appears well-developed and well-nourished.  HENT:  Head: Normocephalic and atraumatic.  Right Ear: External ear normal.  Left Ear: External ear normal.  Nose: Nose normal.  Mouth/Throat: Oropharynx is clear and moist.  Eyes: Pupils are equal, round, and reactive to light. Conjunctivae and EOM are normal.  Neck: Normal range of motion. Neck supple.  Cardiovascular: Normal rate, regular rhythm, normal heart sounds and intact distal pulses.  Pulmonary/Chest: Effort normal and breath sounds normal.  Abdominal: Soft. Bowel sounds are normal. He exhibits distension (Obese).  Musculoskeletal: Normal range of motion.  Neurological: He is alert and oriented to person, place, and time.  Skin: Skin is warm and dry. Capillary refill takes less than 2 seconds.  Psychiatric: He has a normal mood and affect. His behavior is normal. Judgment and thought content normal.  Nursing note and vitals reviewed.  Assessment & Plan:   1. Hospital discharge follow-up  2. Encounter to establish care  3. Mild intermittent asthma, unspecified whether complicated Stable today.  - budesonide-formoterol (SYMBICORT) 80-4.5 MCG/ACT inhaler; Inhale 2 puffs into the lungs 2 (two) times daily.  Dispense: 1 Inhaler;  Refill: 6 - albuterol (PROVENTIL HFA;VENTOLIN HFA) 108 (90 Base) MCG/ACT inhaler; Inhale 1-2 puffs into the lungs every 6 (six) hours as needed.  Dispense: 1 Inhaler; Refill: 11  4. Screening for diabetes mellitus Hgb A1c is mildly elevated at 5.8 today.  He will continue to decrease foods/beverages high in sugars and carbs and follow Heart Healthy or DASH diet. Increase physical activity to at least 30 minutes cardio exercise daily.   - POCT glycosylated hemoglobin (Hb A1C) - POCT urinalysis dipstick - fluticasone (FLONASE) 50 MCG/ACT nasal spray; Place 2 sprays into both nostrils daily.  Dispense: 16 g; Refill: 6  5. Environmental and seasonal allergies We will send Rx for Zyrtec today.  - cetirizine (  ZYRTEC) 10 MG tablet; Take 1 tablet (10 mg total) by mouth daily.  Dispense: 30 tablet; Refill: 11  6. Class 2 severe obesity due to excess calories with serious comorbidity and body mass index (BMI) of 38.0 to 38.9 in adult [facility]) Improved. BMI is 38.99, decreased from 41.35 on 02/18/2018.   7. History of tobacco use He discontinued smoking 3 years ago. He previously smoked 25 years.   8. Itching Advised patient to take Diphenhydramine 25 mg as needed for itching. We will also order Hydrocortisone cream today.  - hydrocortisone cream 0.5 %; Apply 1 application topically 2 (two) times daily.  Dispense: 30 g; Refill: 3  9. Follow up He will follow up in 1 month.   Current Meds  Medication Sig  . albuterol (PROVENTIL HFA;VENTOLIN HFA) 108 (90 Base) MCG/ACT inhaler Inhale 1-2 puffs into the lungs every 6 (six) hours as needed.  Marland Kitchen albuterol (PROVENTIL) (2.5 MG/3ML) 0.083% nebulizer solution Take 3 mLs (2.5 mg total) by nebulization every 6 (six) hours as needed for wheezing or shortness of breath.  . fluticasone (FLONASE) 50 MCG/ACT nasal spray Place 2 sprays into both nostrils daily.  . hydrocortisone cream 0.5 % Apply 1 application topically 2 (two) times daily.  . [DISCONTINUED]  albuterol (PROVENTIL) (2.5 MG/3ML) 0.083% nebulizer solution Take 3 mLs (2.5 mg total) by nebulization every 6 (six) hours as needed for wheezing or shortness of breath.  . [DISCONTINUED] albuterol (PROVENTIL) (2.5 MG/3ML) 0.083% nebulizer solution Take 3 mLs (2.5 mg total) by nebulization every 6 (six) hours as needed for wheezing or shortness of breath.    Raliegh Ip,  MSN, FNP-C Patient [facility] [address] [place], [place] [address] [phone]

## 2018-04-23 ENCOUNTER — Ambulatory Visit: Payer: Self-pay | Attending: Family Medicine

## 2018-05-16 ENCOUNTER — Ambulatory Visit (INDEPENDENT_AMBULATORY_CARE_PROVIDER_SITE_OTHER): Payer: Self-pay | Admitting: Family Medicine

## 2018-05-16 ENCOUNTER — Encounter: Payer: Self-pay | Admitting: Family Medicine

## 2018-05-16 VITALS — BP 134/92 | HR 74 | Temp 98.0°F | Ht 69.0 in | Wt 272.0 lb

## 2018-05-16 DIAGNOSIS — L299 Pruritus, unspecified: Secondary | ICD-10-CM

## 2018-05-16 DIAGNOSIS — K2100 Gastro-esophageal reflux disease with esophagitis, without bleeding: Secondary | ICD-10-CM

## 2018-05-16 DIAGNOSIS — Z09 Encounter for follow-up examination after completed treatment for conditions other than malignant neoplasm: Secondary | ICD-10-CM

## 2018-05-16 DIAGNOSIS — J4520 Mild intermittent asthma, uncomplicated: Secondary | ICD-10-CM

## 2018-05-16 LAB — POCT URINALYSIS DIP (MANUAL ENTRY)
Bilirubin, UA: NEGATIVE
Glucose, UA: NEGATIVE mg/dL
Ketones, POC UA: NEGATIVE mg/dL
Leukocytes, UA: NEGATIVE
Nitrite, UA: NEGATIVE
Protein Ur, POC: NEGATIVE mg/dL
Spec Grav, UA: <=1.005 — AB (ref 1.010–1.025)
Urobilinogen, UA: 0.2 E.U./dL
pH, UA: 5.5 (ref 5.0–8.0)

## 2018-05-16 MED ORDER — HYDROCORTISONE 0.5 % EX CREA: 1.0000 "application " | TOPICAL_CREAM | Freq: Two times a day (BID) | CUTANEOUS | 3 refills | Status: DC

## 2018-05-16 MED ORDER — BUDESONIDE-FORMOTEROL FUMARATE 80-4.5 MCG/ACT IN AERO: 2.0000 | INHALATION_SPRAY | Freq: Two times a day (BID) | RESPIRATORY_TRACT | 6 refills | Status: DC

## 2018-05-16 MED ORDER — OMEPRAZOLE 20 MG PO CPDR: 20.0000 mg | DELAYED_RELEASE_CAPSULE | Freq: Every day | ORAL | 3 refills | Status: DC

## 2018-05-16 MED ORDER — GUAIFENESIN ER 600 MG PO TB12: 600.0000 mg | ORAL_TABLET | Freq: Two times a day (BID) | ORAL | 1 refills | Status: DC

## 2018-05-16 MED FILL — OMEPRAZOLE 20 MG CAP: 20 | 30 days supply | Qty: 30 | Fill #0

## 2018-05-16 MED FILL — SYMBICORT 80-4.5 MCG INH: 80-4.5 | 30 days supply | Qty: 10 | Fill #0

## 2018-05-16 NOTE — Patient Instructions (Addendum)
Guaifenesin oral ER tablets What is this medicine? GUAIFENESIN (gwye FEN e sin) is an expectorant. It helps to thin mucous and make coughs more productive. This medicine is used to treat coughs caused by colds or the flu. It is not intended to treat chronic cough caused by smoking, asthma, emphysema, or heart failure. This medicine may be used for other purposes; ask your health care provider or pharmacist if you have questions. COMMON BRAND NAME(S): Humibid, Mucinex What should I tell my health care provider before I take this medicine? They need to know if you have any of these conditions: -fever -kidney disease -an unusual or allergic reaction to guaifenesin, other medicines, foods, dyes, or preservatives -pregnant or trying to get pregnant -breast-feeding How should I use this medicine? Take this medicine by mouth with a full glass of water. Follow the directions on the prescription label. Do not break, chew or crush this medicine. You may take with food or on an empty stomach. Take your medicine at regular intervals. Do not take your medicine more often than directed. Talk to your pediatrician regarding the use of this medicine in children. While this drug may be prescribed for children as young as 63 years old for selected conditions, precautions do apply. Overdosage: If you think you have taken too much of this medicine contact a poison control center or emergency room at once. NOTE: This medicine is only for you. Do not share this medicine with others. What if I miss a dose? If you miss a dose, take it as soon as you can. If it is almost time for your next dose, take only that dose. Do not take double or extra doses. What may interact with this medicine? Interactions are not expected. This list may not describe all possible interactions. Give your health care provider a list of all the medicines, herbs, non-prescription drugs, or dietary supplements you use. Also tell them if you  smoke, drink alcohol, or use illegal drugs. Some items may interact with your medicine. What should I watch for while using this medicine? Do not treat a cough for more than 1 week without consulting your doctor or health care professional. If you also have a high fever, skin rash, continuing headache, or sore throat, see your doctor. For best results, drink 6 to 8 glasses water daily while you are taking this medicine. What side effects may I notice from receiving this medicine? Side effects that you should report to your doctor or health care professional as soon as possible: -allergic reactions like skin rash, itching or hives, swelling of the face, lips, or tongue Side effects that usually do not require medical attention (report to your doctor or health care professional if they continue or are bothersome): -dizziness -headache -stomach upset This list may not describe all possible side effects. Call your doctor for medical advice about side effects. You may report side effects to FDA at 1-800-FDA-1088. Where should I keep my medicine? Keep out of the reach of children. Store at room temperature between 20 and 25 degrees C (68 and 77 degrees F). Keep container tightly closed. Throw away any unused medicine after the expiration date. NOTE: This sheet is a summary. It may not cover all possible information. If you have questions about this medicine, talk to your doctor, pharmacist, or health care provider.  2018 Elsevier/Gold Standard (2007-12-10 12:14:14) Omeprazole capsules (sprinkle caps) - Rx What is this medicine? OMEPRAZOLE (oh ME pray zol) prevents the production of acid  in the stomach. It is used to treat gastroesophageal reflux disease (GERD), ulcers, certain bacteria in the stomach, inflammation of the esophagus, and Zollinger-Ellison Syndrome. It is also used to treat other conditions that cause too much stomach acid. This medicine may be used for other purposes; ask your health  care provider or pharmacist if you have questions. COMMON BRAND NAME(S): Prilosec What should I tell my health care provider before I take this medicine? They need to know if you have any of these conditions: -liver disease -low levels of magnesium in the blood -lupus -an unusual or allergic reaction to omeprazole, other medicines, foods, dyes, or preservatives -pregnant or trying to get pregnant -breast-feeding How should I use this medicine? Take this medicine by mouth with a glass of water. Follow the directions on the prescription label. Do not crush, break or chew the capsules. They can be opened and the contents sprinkled on a small amount of applesauce or yogurt, given with fruit juices, or swallowed immediately with water. This medicine works best if taken on an empty stomach 30 to 60 minutes before breakfast. Take your doses at regular intervals. Do not take your medicine more often than directed. Talk to your pediatrician regarding the use of this medicine in children. Special care may be needed. Overdosage: If you think you have taken too much of this medicine contact a poison control center or emergency room at once. NOTE: This medicine is only for you. Do not share this medicine with others. What if I miss a dose? If you miss a dose, take it as soon as you can. If it is almost time for your next dose, take only that dose. Do not take double or extra doses. What may interact with this medicine? Do not take this medicine with any of the following medications: -atazanavir -clopidogrel -nelfinavir This medicine may also interact with the following medications: -ampicillin -certain medicines for anxiety or sleep -certain medicines that treat or prevent blood clots like warfarin -cyclosporine -diazepam -digoxin -disulfiram -diuretics -iron salts -methotrexate -mycophenolate mofetil -phenytoin -prescription medicine for fungal or yeast infection like itraconazole,  ketoconazole, voriconazole -saquinavir -tacrolimus This list may not describe all possible interactions. Give your health care provider a list of all the medicines, herbs, non-prescription drugs, or dietary supplements you use. Also tell them if you smoke, drink alcohol, or use illegal drugs. Some items may interact with your medicine. What should I watch for while using this medicine? It can take several days before your stomach pain gets better. Check with your doctor or health care professional if your condition does not start to get better, or if it gets worse. You may need blood work done while you are taking this medicine. What side effects may I notice from receiving this medicine? Side effects that you should report to your doctor or health care professional as soon as possible: -allergic reactions like skin rash, itching or hives, swelling of the face, lips, or tongue -bone, muscle or joint pain -breathing problems -chest pain or chest tightness -dark yellow or brown urine -dizziness -fast, irregular heartbeat -feeling faint or lightheaded -fever or sore throat -muscle spasm -palpitations -rash on cheeks or arms that gets worse in the sun -redness, blistering, peeling or loosening of the skin, including inside the mouth -seizures -tremors -unusual bleeding or bruising -unusually weak or tired -yellowing of the eyes or skin Side effects that usually do not require medical attention (report to your doctor or health care professional if they continue or  are bothersome): -constipation -diarrhea -dry mouth -headache -nausea This list may not describe all possible side effects. Call your doctor for medical advice about side effects. You may report side effects to FDA at 1-800-FDA-1088. Where should I keep my medicine? Keep out of the reach of children. Store at room temperature between 15 and 30 degrees C (59 and 86 degrees F). Protect from light and moisture. Throw away any  unused medicine after the expiration date. NOTE: This sheet is a summary. It may not cover all possible information. If you have questions about this medicine, talk to your doctor, pharmacist, or health care provider.  2018 Elsevier/Gold Standard (2015-09-01 12:18:47)

## 2018-05-16 NOTE — Progress Notes (Signed)
Follow Up  Subjective:    Patient ID: Cole Morales, male    DOB: April 11, 1976, 42 y.o.   MRN: [id number]   Chief Complaint  Patient presents with  . Follow-up    chronic conditions  . Heartburn    HPI  Mr. Cole Morales is a 42 year old male with a past medical history of Asthma. He is here today for follow up.  Current Status: Since his last office visit, he is doing well with no complaints. He states that he has continuous discomfort in his upper chest, that is relieved upon belching.   He denies fevers, chills, fatigue, recent infections, weight loss, and night sweats. He has not had any headaches, visual changes, dizziness, and falls. No chest pain, heart palpitations, cough and shortness of breath reported. No reports of GI problems such as nausea, vomiting, diarrhea, and constipation. He has no reports of blood in stools, dysuria and hematuria. No depression or anxiety reported. He denies pain today.   Past Medical History:  Diagnosis Date  . Asthma   . GERD (gastroesophageal reflux disease)     Family History  Problem Relation Age of Onset  . Asthma Mother   . Cancer Maternal Grandmother   . Cancer Maternal Grandfather     Social History   Socioeconomic History  . Marital status: Single    Spouse name: Not on file  . Number of children: Not on file  . Years of education: Not on file  . Highest education level: Not on file  Occupational History  . Not on file  Social Needs  . Financial resource strain: Not on file  . Food insecurity:    Worry: Not on file    Inability: Not on file  . Transportation needs:    Medical: Not on file    Non-medical: Not on file  Tobacco Use  . Smoking status: Former Smoker    Types: Cigarettes    Last attempt to quit: 01/19/2018    Years since quitting: 0.3  . Smokeless tobacco: Never Used  Substance and Sexual Activity  . Alcohol use: Yes  . Drug use: Yes    Types: Marijuana  . Sexual activity: Not on file  Lifestyle  .  Physical activity:    Days per week: Not on file    Minutes per session: Not on file  . Stress: Not on file  Relationships  . Social connections:    Talks on phone: Not on file    Gets together: Not on file    Attends religious service: Not on file    Active member of club or organization: Not on file    Attends meetings of clubs or organizations: Not on file    Relationship status: Not on file  . Intimate partner violence:    Fear of current or ex partner: Not on file    Emotionally abused: Not on file    Physically abused: Not on file    Forced sexual activity: Not on file  Other Topics Concern  . Not on file  Social History Narrative  . Not on file    Past Surgical History:  Procedure Laterality Date  . MOUTH SURGERY       There is no immunization history on file for this patient.  Current Meds  Medication Sig  . albuterol (PROVENTIL HFA;VENTOLIN HFA) 108 (90 Base) MCG/ACT inhaler Inhale 1-2 puffs into the lungs every 6 (six) hours as needed.  Marland Kitchen albuterol (PROVENTIL) (2.5 MG/3ML) 0.083% nebulizer  solution Take 3 mLs (2.5 mg total) by nebulization every 6 (six) hours as needed for wheezing or shortness of breath.  . budesonide-formoterol (SYMBICORT) 80-4.5 MCG/ACT inhaler Inhale 2 puffs into the lungs 2 (two) times daily.  . fluticasone (FLONASE) 50 MCG/ACT nasal spray Place 2 sprays into both nostrils daily.  . [DISCONTINUED] budesonide-formoterol (SYMBICORT) 80-4.5 MCG/ACT inhaler Inhale 2 puffs into the lungs 2 (two) times daily.   No Known Allergies  BP (!) 134/92 (BP Location: Left Arm, Patient Position: Sitting, Cuff Size: Large)   Pulse 74   Temp 98 F (36.7 C) (Oral)   Ht 5\' 9"  (1.753 m)   Wt 272 lb (123.4 kg)   SpO2 98%   BMI 40.17 kg/m   Review of Systems  HENT: Negative.   Respiratory: Positive for shortness of breath (Occasional ).   Cardiovascular: Negative.   Gastrointestinal: Positive for abdominal distention (Obese).  Musculoskeletal: Positive  for arthralgias (generalized).  Neurological: Positive for weakness and headaches (Occasional ).  Psychiatric/Behavioral: Negative.    Objective:   Physical Exam  Constitutional: He is oriented to person, place, and time. He appears well-developed and well-nourished.  Cardiovascular: Normal rate, regular rhythm, normal heart sounds and intact distal pulses.  Pulmonary/Chest: Effort normal and breath sounds normal.  Abdominal: Soft. Bowel sounds are normal.  Musculoskeletal: Normal range of motion.  Neurological: He is alert and oriented to person, place, and time.  Skin: Skin is warm and dry.  Psychiatric: He has a normal mood and affect. His behavior is normal. Judgment and thought content normal.  Nursing note and vitals reviewed.  Assessment & Plan:   1. Mild intermittent asthma, unspecified whether complicated Stable. Continue inhalers as prescribed. - budesonide-formoterol (SYMBICORT) 80-4.5 MCG/ACT inhaler; Inhale 2 puffs into the lungs 2 (two) times daily.  Dispense: 1 Inhaler; Refill: 6  2. Gastroesophageal reflux disease with esophagitis Initiate trial of Prilosec today.  - omeprazole (PRILOSEC) 20 MG capsule; Take 1 capsule (20 mg total) by mouth daily.  Dispense: 30 capsule; Refill: 3  3. Itching We will initiate Hydrocortisone cream today  - hydrocortisone cream 0.5 %; Apply 1 application topically 2 (two) times daily.  Dispense: 30 g; Refill: 3  4. Follow up He will follow up in 2 months.  - POCT urinalysis dipstick  Meds ordered this encounter  Medications  . omeprazole (PRILOSEC) 20 MG capsule    Sig: Take 1 capsule (20 mg total) by mouth daily.    Dispense:  30 capsule    Refill:  3  . guaiFENesin (MUCINEX) 600 MG 12 hr tablet    Sig: Take 1 tablet (600 mg total) by mouth 2 (two) times daily.    Dispense:  30 tablet    Refill:  1  . budesonide-formoterol (SYMBICORT) 80-4.5 MCG/ACT inhaler    Sig: Inhale 2 puffs into the lungs 2 (two) times daily.     Dispense:  1 Inhaler    Refill:  6  . hydrocortisone cream 0.5 %    Sig: Apply 1 application topically 2 (two) times daily.    Dispense:  30 g    Refill:  3    Raliegh Ip,  MSN, FNP-C Patient [facility] [address] [place], [place] [address] [phone]

## 2018-06-11 MED FILL — FLUTICASONE PROP 50 MCG SPR: 50 | 30 days supply | Qty: 16 | Fill #1

## 2018-06-12 MED FILL — OMEPRAZOLE 20 MG CAP: 20 | 30 days supply | Qty: 30 | Fill #1

## 2018-06-16 MED FILL — !SYMBICORT 80-4.5 MCG INH: 80-4.5 | 30 days supply | Qty: 1 | Fill #1

## 2018-07-15 MED FILL — OMEPRAZOLE 20 MG CAP: 20 | 30 days supply | Qty: 30 | Fill #2

## 2018-07-15 MED FILL — SYMBICORT 80-4.5 MCG INH: 80-4.5 | 30 days supply | Qty: 10 | Fill #2

## 2018-07-16 ENCOUNTER — Ambulatory Visit: Payer: Self-pay | Admitting: Family Medicine

## 2018-07-23 ENCOUNTER — Ambulatory Visit (HOSPITAL_COMMUNITY)
Admission: RE | Admit: 2018-07-23 | Discharge: 2018-07-23 | Disposition: A | Discharge status: Home / Self Care | Payer: Self-pay | Source: Ambulatory Visit | Attending: Family Medicine | Admitting: Family Medicine

## 2018-07-23 ENCOUNTER — Ambulatory Visit (INDEPENDENT_AMBULATORY_CARE_PROVIDER_SITE_OTHER): Payer: Self-pay | Admitting: Family Medicine

## 2018-07-23 ENCOUNTER — Encounter: Payer: Self-pay | Admitting: Family Medicine

## 2018-07-23 VITALS — BP 136/93 | HR 81 | Temp 97.6°F | Resp 16 | Ht 69.0 in | Wt 281.0 lb

## 2018-07-23 DIAGNOSIS — E785 Hyperlipidemia, unspecified: Secondary | ICD-10-CM

## 2018-07-23 DIAGNOSIS — R0683 Snoring: Secondary | ICD-10-CM

## 2018-07-23 DIAGNOSIS — Z87891 Personal history of nicotine dependence: Secondary | ICD-10-CM

## 2018-07-23 DIAGNOSIS — J4520 Mild intermittent asthma, uncomplicated: Secondary | ICD-10-CM

## 2018-07-23 DIAGNOSIS — G8929 Other chronic pain: Secondary | ICD-10-CM | POA: Insufficient documentation

## 2018-07-23 DIAGNOSIS — L309 Dermatitis, unspecified: Secondary | ICD-10-CM

## 2018-07-23 DIAGNOSIS — Z6838 Body mass index (BMI) 38.0-38.9, adult: Secondary | ICD-10-CM

## 2018-07-23 DIAGNOSIS — R5383 Other fatigue: Secondary | ICD-10-CM

## 2018-07-23 DIAGNOSIS — M25512 Pain in left shoulder: Secondary | ICD-10-CM

## 2018-07-23 MED ORDER — KETOCONAZOLE 2 % EX SHAM: 1.0000 "application " | MEDICATED_SHAMPOO | CUTANEOUS | 0 refills | Status: DC

## 2018-07-23 NOTE — Patient Instructions (Addendum)
Exercising to Lose Weight Exercising can help you to lose weight. In order to lose weight through exercise, you need to do vigorous-intensity exercise. You can tell that you are exercising with vigorous intensity if you are breathing very hard and fast and cannot hold a conversation while exercising. Moderate-intensity exercise helps to maintain your current weight. You can tell that you are exercising at a moderate level if you have a higher heart rate and faster breathing, but you are still able to hold a conversation. How often should I exercise? Choose an activity that you enjoy and set realistic goals. Your health care provider can help you to make an activity plan that works for you. Exercise regularly as directed by your health care provider. This may include:  Doing resistance training twice each week, such as: ? Push-ups. ? Sit-ups. ? Lifting weights. ? Using resistance bands.  Doing a given intensity of exercise for a given amount of time. Choose from these options: ? 150 minutes of moderate-intensity exercise every week. ? 75 minutes of vigorous-intensity exercise every week. ? A mix of moderate-intensity and vigorous-intensity exercise every week.  Children, pregnant women, people who are out of shape, people who are overweight, and older adults may need to consult a health care provider for individual recommendations. If you have any sort of medical condition, be sure to consult your health care provider before starting a new exercise program. What are some activities that can help me to lose weight?  Walking at a rate of at least 4.5 miles an hour.  Jogging or running at a rate of 5 miles per hour.  Biking at a rate of at least 10 miles per hour.  Lap swimming.  Roller-skating or in-line skating.  Cross-country skiing.  Vigorous competitive sports, such as football, basketball, and soccer.  Jumping rope.  Aerobic dancing. How can I be more active in my day-to-day  activities?  Use the stairs instead of the elevator.  Take a walk during your lunch break.  If you drive, park your car farther away from work or school.  If you take public transportation, get off one stop early and walk the rest of the way.  Make all of your phone calls while standing up and walking around.  Get up, stretch, and walk around every 30 minutes throughout the day. What guidelines should I follow while exercising?  Do not exercise so much that you hurt yourself, feel dizzy, or get very short of breath.  Consult your health care provider prior to starting a new exercise program.  Wear comfortable clothes and shoes with good support.  Drink plenty of water while you exercise to prevent dehydration or heat stroke. Body water is lost during exercise and must be replaced.  Work out until you breathe faster and your heart beats faster. This information is not intended to replace advice given to you by your health care provider. Make sure you discuss any questions you have with your health care provider. Document Released: 09/01/2010 Document Revised: 01/05/2016 Document Reviewed: 12/31/2013 Elsevier Interactive Patient Education  2018 [employer]. Health Maintenance, Male A healthy lifestyle and preventive care is important for your health and wellness. Ask your health care provider about what schedule of regular examinations is right for you. What should I know about weight and diet? Eat a Healthy Diet  Eat plenty of vegetables, fruits, whole grains, low-fat dairy products, and lean protein.  Do not eat a lot of foods high in solid fats, added  sugars, or salt.  Maintain a Healthy Weight Regular exercise can help you achieve or maintain a healthy weight. You should:  Do at least 150 minutes of exercise each week. The exercise should increase your heart rate and make you sweat (moderate-intensity exercise).  Do strength-training exercises at least twice a  week.  Watch Your Levels of Cholesterol and Blood Lipids  Have your blood tested for lipids and cholesterol every 5 years starting at 43 years of age. If you are at high risk for heart disease, you should start having your blood tested when you are 43 years old. You may need to have your cholesterol levels checked more often if: ? Your lipid or cholesterol levels are high. ? You are older than 43 years of age. ? You are at high risk for heart disease.  What should I know about cancer screening? Many types of cancers can be detected early and may often be prevented. Lung Cancer  You should be screened every year for lung cancer if: ? You are a current smoker who has smoked for at least 30 years. ? You are a former smoker who has quit within the past 15 years.  Talk to your health care provider about your screening options, when you should start screening, and how often you should be screened.  Colorectal Cancer  Routine colorectal cancer screening usually begins at 43 years of age and should be repeated every 5-10 years until you are 43 years old. You may need to be screened more often if early forms of precancerous polyps or small growths are found. Your health care provider may recommend screening at an earlier age if you have risk factors for colon cancer.  Your health care provider may recommend using home test kits to check for hidden blood in the stool.  A small camera at the end of a tube can be used to examine your colon (sigmoidoscopy or colonoscopy). This checks for the earliest forms of colorectal cancer.  Prostate and Testicular Cancer  Depending on your age and overall health, your health care provider may do certain tests to screen for prostate and testicular cancer.  Talk to your health care provider about any symptoms or concerns you have about testicular or prostate cancer.  Skin Cancer  Check your skin from head to toe regularly.  Tell your health care provider  about any new moles or changes in moles, especially if: ? There is a change in a mole's size, shape, or color. ? You have a mole that is larger than a pencil eraser.  Always use sunscreen. Apply sunscreen liberally and repeat throughout the day.  Protect yourself by wearing long sleeves, pants, a wide-brimmed hat, and sunglasses when outside.  What should I know about heart disease, diabetes, and high blood pressure?  If you are 86-3 years of age, have your blood pressure checked every 3-5 years. If you are 9 years of age or older, have your blood pressure checked every year. You should have your blood pressure measured twice-once when you are at a hospital or clinic, and once when you are not at a hospital or clinic. Record the average of the two measurements. To check your blood pressure when you are not at a hospital or clinic, you can use: ? An automated blood pressure machine at a pharmacy. ? A home blood pressure monitor.  Talk to your health care provider about your target blood pressure.  If you are between 45-58 years old, ask your  health care provider if you should take aspirin to prevent heart disease.  Have regular diabetes screenings by checking your fasting blood sugar level. ? If you are at a normal weight and have a low risk for diabetes, have this test once every three years after the age of 66. ? If you are overweight and have a high risk for diabetes, consider being tested at a younger age or more often.  A one-time screening for abdominal aortic aneurysm (AAA) by ultrasound is recommended for men aged 65-75 years who are current or former smokers. What should I know about preventing infection? Hepatitis B If you have a higher risk for hepatitis B, you should be screened for this virus. Talk with your health care provider to find out if you are at risk for hepatitis B infection. Hepatitis C Blood testing is recommended for:  Everyone born from [birth year] through  1966.  Anyone with known risk factors for hepatitis C.  Sexually Transmitted Diseases (STDs)  You should be screened each year for STDs including gonorrhea and chlamydia if: ? You are sexually active and are younger than 43 years of age. ? You are older than 43 years of age and your health care provider tells you that you are at risk for this type of infection. ? Your sexual activity has changed since you were last screened and you are at an increased risk for chlamydia or gonorrhea. Ask your health care provider if you are at risk.  Talk with your health care provider about whether you are at high risk of being infected with HIV. Your health care provider may recommend a prescription medicine to help prevent HIV infection.  What else can I do?  Schedule regular health, dental, and eye exams.  Stay current with your vaccines (immunizations).  Do not use any tobacco products, such as cigarettes, chewing tobacco, and e-cigarettes. If you need help quitting, ask your health care provider.  Limit alcohol intake to no more than 2 drinks per day. One drink equals 12 ounces of beer, 5 ounces of wine, or 1 ounces of hard liquor.  Do not use street drugs.  Do not share needles.  Ask your health care provider for help if you need support or information about quitting drugs.  Tell your health care provider if you often feel depressed.  Tell your health care provider if you have ever been abused or do not feel safe at home. This information is not intended to replace advice given to you by your health care provider. Make sure you discuss any questions you have with your health care provider. Document Released: 01/26/2008 Document Revised: 03/28/2016 Document Reviewed: 05/03/2015 Elsevier Interactive Patient Education  2018 [employer] Shoulder Pain Many things can cause shoulder pain, including:  An injury.  Moving the arm in the same way again and again (overuse).  Joint pain  (arthritis).  Follow these instructions at home: Take these actions to help with your pain:  Squeeze a soft ball or a foam pad as much as you can. This helps to prevent swelling. It also makes the arm stronger.  Take over-the-counter and prescription medicines only as told by your doctor.  If told, put ice on the area: ? Put ice in a plastic bag. ? Place a towel between your skin and the bag. ? Leave the ice on for 20 minutes, 2-3 times per day. Stop putting on ice if it does not help with the pain.  If you were given a  shoulder sling or immobilizer: ? Wear it as told. ? Remove it to shower or bathe. ? Move your arm as little as possible. ? Keep your hand moving. This helps prevent swelling.  Contact a doctor if:  Your pain gets worse.  Medicine does not help your pain.  You have new pain in your arm, hand, or fingers. Get help right away if:  Your arm, hand, or fingers: ? Tingle. ? Are numb. ? Are swollen. ? Are painful. ? Turn white or blue. This information is not intended to replace advice given to you by your health care provider. Make sure you discuss any questions you have with your health care provider. Document Released: 01/16/2008 Document Revised: 03/25/2016 Document Reviewed: 11/22/2014 Elsevier Interactive Patient Education  2018 Else[employer] Inc. Sleep Apnea Sleep apnea is a condition that affects breathing. People with sleep apnea have moments during sleep when their breathing pauses briefly or gets shallow. Sleep apnea can cause these symptoms:  Trouble staying asleep.  Sleepiness or tiredness during the day.  Irritability.  Loud snoring.  Morning headaches.  Trouble concentrating.  Forgetting things.  Less interest in sex.  Being sleepy for no reason.  Mood swings.  Personality changes.  Depression.  Waking up a lot during the night to pee (urinate).  Dry mouth.  Sore throat.  Follow these instructions at home:  Make any changes  in your routine that your doctor recommends.  Eat a healthy, well-balanced diet.  Take over-the-counter and prescription medicines only as told by your doctor.  Avoid using alcohol, calming medicines (sedatives), and narcotic medicines.  Take steps to lose weight if you are overweight.  If you were given a machine (device) to use while you sleep, use it only as told by your doctor.  Do not use any tobacco products, such as cigarettes, chewing tobacco, and e-cigarettes. If you need help quitting, ask your doctor.  Keep all follow-up visits as told by your doctor. This is important. Contact a doctor if:  The machine that you were given to use during sleep is uncomfortable or does not seem to be working.  Your symptoms do not get better.  Your symptoms get worse. Get help right away if:  Your chest hurts.  You have trouble breathing in enough air (shortness of breath).  You have an uncomfortable feeling in your back, arms, or stomach.  You have trouble talking.  One side of your body feels weak.  A part of your face is hanging down (drooping). These symptoms may be an emergency. Do not wait to see if the symptoms will go away. Get medical help right away. Call your local emergency services (911 in the [place] Do not drive yourself to the hospital. This information is not intended to replace advice given to you by your health care provider. Make sure you discuss any questions you have with your health care provider. Document Released: 05/08/2008 Document Revised: 03/25/2016 Document Reviewed: 05/09/2015 Elsevier Interactive Patient Education  [employer]2018 Elsevier Inc.

## 2018-07-23 NOTE — Progress Notes (Signed)
[facility]   Progress Note: General Provider: Mike GipAndre Boy Delamater, FNP  SUBJECTIVE:   Cole Morales is a 42 y.o. male who  has a past medical history of Asthma and GERD (gastroesophageal reflux disease).. Patient presents today for Follow-up and Asthma Patient's PCP is N. Stroud. States that he is doing well on new medications- Symbicort and albuterol. He stopped smoking in June 2019. Has been eating more due to this. 9 pound weight gain noted since last visit.  Patient reports walking at work. No regular exercise routine. No carb or sodium modified diet. He states that he does not drink enough fluids.  He states that he has been feeling tired and weak throughout the day. He reports snoring. Unsure of apneic episodes.  He also states that he has a rash on his back that waxes and weans. Itches intermittently. Denies increased itching at night. Denies hx of bed bugs or new bedding.  Review of Systems  Constitutional: Negative.   HENT: Negative.   Eyes: Negative.   Respiratory: Negative.   Cardiovascular: Negative.   Gastrointestinal: Negative.   Genitourinary: Negative.   Musculoskeletal: Negative.   Skin: Positive for itching and rash (back with erythema. ).  Neurological: Negative.   Psychiatric/Behavioral: Negative.      OBJECTIVE: BP (!) 136/93 (BP Location: Left Arm, Patient Position: Sitting, Cuff Size: Large)   Pulse 81   Temp 97.6 F (36.4 C) (Oral)   Resp 16   Ht 5\' 9"  (1.753 m)   Wt 281 lb (127.5 kg)   SpO2 99%   BMI 41.50 kg/m   Wt Readings from Last 3 Encounters:  07/23/18 281 lb (127.5 kg)  05/16/18 272 lb (123.4 kg)  04/15/18 264 lb (119.7 kg)     Physical Exam  Constitutional: He is oriented to person, place, and time. He appears well-developed and well-nourished. No distress.  HENT:  Head: Normocephalic and atraumatic.  Eyes: Pupils are equal, round, and reactive to light. Conjunctivae and EOM are normal.    Neck: Normal range of motion.  Cardiovascular: Normal rate, regular rhythm, normal heart sounds and intact distal pulses.  Pulmonary/Chest: Effort normal and breath sounds normal. No respiratory distress.  Abdominal: Soft. Bowel sounds are normal. He exhibits no distension.  Musculoskeletal: Normal range of motion.  Neurological: He is alert and oriented to person, place, and time.  Skin: Skin is warm and dry. Rash (sporadic distrubution on the back and  trunk. Various levels of excoriation) noted.  Psychiatric: He has a normal mood and affect. His behavior is normal. Judgment and thought content normal.  Nursing note and vitals reviewed.   ASSESSMENT/PLAN:   1. Mild intermittent asthma, unspecified whether complicated The current medical regimen is effective;  continue present plan and medications.   2. Fatigue, unspecified type Pending labs. Will adjust medications accordingly.    - CBC with Differential - Comprehensive metabolic panel - TSH - Split night study; Future  3. Class 2 severe obesity due to excess calories with serious comorbidity and body mass index (BMI) of 38.0 to 38.9 in adult [facility](HCC) Pending labs. Will adjust medications accordingly.    - Lipid Panel - Split night study; Future  4. History of tobacco use Praised for smoking cessation. Tips given to help maintain.   5. Snoring - Split night study; Future  6. Chronic left shoulder pain - DG Shoulder Left; Future  7. Dermatitis - ketoconazole (NIZORAL) 2 % shampoo; Apply 1 application topically 2 (two)  times a week.  Dispense: 120 mL; Refill: 0        The patient was given clear instructions to go to ER or return to medical center if symptoms do not improve, worsen or new problems develop. The patient verbalized understanding and agreed with plan of care.   Ms. Freda Jackson. Riley Lam, FNP-BC [facility] [address] [place], [place]  [address] [phone]     This note has been created with Dragon speech recognition software and smart phrase technology. Any transcriptional errors are unintentional.

## 2018-07-24 LAB — LIPID PANEL
Chol/HDL Ratio: 6.9 ratio — ABNORMAL HIGH (ref 0.0–5.0)
Cholesterol, Total: 215 mg/dL — ABNORMAL HIGH (ref 100–199)
HDL: 31 mg/dL — ABNORMAL LOW (ref >39)
LDL Calculated: 143 mg/dL — ABNORMAL HIGH (ref 0–99)
Triglycerides: 207 mg/dL — ABNORMAL HIGH (ref 0–149)
VLDL Cholesterol Cal: 41 mg/dL — ABNORMAL HIGH (ref 5–40)

## 2018-07-24 LAB — CBC WITH DIFFERENTIAL/PLATELET
Basophils Absolute: 0.1 10*3/uL (ref 0.0–0.2)
Basos: 2 %
EOS (ABSOLUTE): 0.1 10*3/uL (ref 0.0–0.4)
Eos: 2 %
Hematocrit: 48.9 % (ref 37.5–51.0)
Hemoglobin: 16.2 g/dL (ref 13.0–17.7)
Immature Grans (Abs): 0 10*3/uL (ref 0.0–0.1)
Immature Granulocytes: 1 %
Lymphocytes Absolute: 1.6 10*3/uL (ref 0.7–3.1)
Lymphs: 25 %
MCH: 27.4 pg (ref 26.6–33.0)
MCHC: 33.1 g/dL (ref 31.5–35.7)
MCV: 83 fL (ref 79–97)
Monocytes Absolute: 0.5 10*3/uL (ref 0.1–0.9)
Monocytes: 7 %
Neutrophils Absolute: 4.2 10*3/uL (ref 1.4–7.0)
Neutrophils: 63 %
Platelets: 216 10*3/uL (ref 150–450)
RBC: 5.91 x10E6/uL — ABNORMAL HIGH (ref 4.14–5.80)
RDW: 12.1 % — ABNORMAL LOW (ref 12.3–15.4)
WBC: 6.6 10*3/uL (ref 3.4–10.8)

## 2018-07-24 LAB — COMPREHENSIVE METABOLIC PANEL
ALT: 54 IU/L — ABNORMAL HIGH (ref 0–44)
AST: 32 IU/L (ref 0–40)
Albumin/Globulin Ratio: 1.7 (ref 1.2–2.2)
Albumin: 4.6 g/dL (ref 3.5–5.5)
Alkaline Phosphatase: 88 IU/L (ref 39–117)
BUN/Creatinine Ratio: 9 (ref 9–20)
BUN: 9 mg/dL (ref 6–24)
Bilirubin Total: 0.3 mg/dL (ref 0.0–1.2)
CO2: 23 mmol/L (ref 20–29)
Calcium: 9.7 mg/dL (ref 8.7–10.2)
Chloride: 102 mmol/L (ref 96–106)
Creatinine, Ser: 0.96 mg/dL (ref 0.76–1.27)
GFR calc Af Amer: 111 mL/min/{1.73_m2} (ref >59)
GFR calc non Af Amer: 96 mL/min/{1.73_m2} (ref >59)
Globulin, Total: 2.7 g/dL (ref 1.5–4.5)
Glucose: 132 mg/dL — ABNORMAL HIGH (ref 65–99)
Potassium: 4.3 mmol/L (ref 3.5–5.2)
Sodium: 141 mmol/L (ref 134–144)
Total Protein: 7.3 g/dL (ref 6.0–8.5)

## 2018-07-24 LAB — TSH: TSH: 2.43 u[IU]/mL (ref 0.450–4.500)

## 2018-07-31 ENCOUNTER — Telehealth: Payer: Self-pay

## 2018-07-31 MED ORDER — ATORVASTATIN CALCIUM 20 MG PO TABS: 20.0000 mg | ORAL_TABLET | Freq: Every day | ORAL | 11 refills | Status: DC

## 2018-07-31 NOTE — Telephone Encounter (Signed)
Called, no answer. Left a message for patient to return call. Thanks!    Normal xray of the left shoulder.

## 2018-07-31 NOTE — Telephone Encounter (Signed)
-----   Message from Mike GipAndre Douglas, FNP sent at 07/31/2018  3:33 PM EST ----- Please inform patient that his cholesterol is high. I will send a medication to the pharmacy for this. The patient is asked to make an attempt to improve diet and exercise patterns to aid in medical management of this problem.

## 2018-07-31 NOTE — Addendum Note (Signed)
Addended by: Reatha HarpsUGLAS, Cindel Daugherty L on: 07/31/2018 03:33 PM   Modules accepted: Orders

## 2018-08-01 NOTE — Telephone Encounter (Signed)
Called, no answer. Left a voicemail to call back. Thanks!  

## 2018-08-04 NOTE — Telephone Encounter (Signed)
Unable to reach patient  By phone. Will mail letter.

## 2018-08-16 ENCOUNTER — Encounter (HOSPITAL_COMMUNITY): Payer: Self-pay | Admitting: Emergency Medicine

## 2018-08-16 ENCOUNTER — Emergency Department (HOSPITAL_COMMUNITY)
Admission: EM | Admit: 2018-08-16 | Discharge: 2018-08-16 | Disposition: A | Discharge status: Home / Self Care | Payer: BLUE CROSS/BLUE SHIELD | Attending: Emergency Medicine | Admitting: Emergency Medicine

## 2018-08-16 ENCOUNTER — Emergency Department (HOSPITAL_COMMUNITY): Payer: BLUE CROSS/BLUE SHIELD

## 2018-08-16 ENCOUNTER — Other Ambulatory Visit: Payer: Self-pay

## 2018-08-16 DIAGNOSIS — J45909 Unspecified asthma, uncomplicated: Secondary | ICD-10-CM | POA: Diagnosis not present

## 2018-08-16 DIAGNOSIS — R079 Chest pain, unspecified: Secondary | ICD-10-CM

## 2018-08-16 DIAGNOSIS — K2100 Gastro-esophageal reflux disease with esophagitis, without bleeding: Secondary | ICD-10-CM

## 2018-08-16 DIAGNOSIS — Z79899 Other long term (current) drug therapy: Secondary | ICD-10-CM | POA: Insufficient documentation

## 2018-08-16 DIAGNOSIS — Z87891 Personal history of nicotine dependence: Secondary | ICD-10-CM | POA: Insufficient documentation

## 2018-08-16 LAB — BASIC METABOLIC PANEL
Anion gap: 9 (ref 5–15)
BUN: 7 mg/dL (ref 6–20)
CHLORIDE: 105 mmol/L (ref 98–111)
CO2: 26 mmol/L (ref 22–32)
Calcium: 9.5 mg/dL (ref 8.9–10.3)
Creatinine, Ser: 0.92 mg/dL (ref 0.61–1.24)
Glucose, Bld: 111 mg/dL — ABNORMAL HIGH (ref 70–99)
Potassium: 3.9 mmol/L (ref 3.5–5.1)
Sodium: 140 mmol/L (ref 135–145)

## 2018-08-16 LAB — CBC
HCT: 48 % (ref 39.0–52.0)
Hemoglobin: 16.2 g/dL (ref 13.0–17.0)
MCH: 28.2 pg (ref 26.0–34.0)
MCHC: 33.8 g/dL (ref 30.0–36.0)
MCV: 83.5 fL (ref 80.0–100.0)
PLATELETS: 215 10*3/uL (ref 150–400)
RBC: 5.75 MIL/uL (ref 4.22–5.81)
RDW: 12.2 % (ref 11.5–15.5)
WBC: 6.5 10*3/uL (ref 4.0–10.5)
nRBC: 0 % (ref 0.0–0.2)

## 2018-08-16 LAB — I-STAT TROPONIN, ED: Troponin i, poc: 0 ng/mL (ref 0.00–0.08)

## 2018-08-16 MED ORDER — OMEPRAZOLE 20 MG PO CPDR: 20.0000 mg | DELAYED_RELEASE_CAPSULE | Freq: Two times a day (BID) | ORAL | 3 refills | Status: DC

## 2018-08-16 NOTE — ED Triage Notes (Signed)
Co chest pain daily since June.  States pain worse today and moves from L, center, and R chest.  Also reports SOB and feeling lightheaded.  Denies nausea and vomiting.  Also reports pain to L shoulder.

## 2018-08-16 NOTE — ED Provider Notes (Signed)
[facility] Provider Note   CSN: [id number] Arrival date & time: 08/16/18  0107     History   Chief Complaint Chief Complaint  Patient presents with  . Chest Pain    HPI Cole Morales is a 43 y.o. male.  HPI Patient presents with chest pain.  Normally has chest pain on the left side of the chest.  Has had for the last 6 months.  Has been evaluated for it.  Thought to be related either to GERD or asthma.  However last night began to have some shortness of breath and feel the pain on the right side of his chest.  States he normally does not have pain over there.  Occasional cough.  No fevers.  Pain does not get on with exertion but does worsen with eating.  He is on omeprazole already.  Has not had a endoscopy or gastroenterology evaluation.  Does have a primary care doctor.  Some mild swelling in his legs that is unchanged.  Occasional cough. Past Medical History:  Diagnosis Date  . Asthma   . GERD (gastroesophageal reflux disease)     There are no active problems to display for this patient.   Past Surgical History:  Procedure Laterality Date  . MOUTH SURGERY          Home Medications    Prior to Admission medications   Medication Sig Start Date End Date Taking? Authorizing Provider  albuterol (PROVENTIL HFA;VENTOLIN HFA) 108 (90 Base) MCG/ACT inhaler Inhale 1-2 puffs into the lungs every 6 (six) hours as needed. 04/15/18   Kallie LocksStroud, Natalie M, FNP  albuterol (PROVENTIL) (2.5 MG/3ML) 0.083% nebulizer solution Take 3 mLs (2.5 mg total) by nebulization every 6 (six) hours as needed for wheezing or shortness of breath. 04/15/18   Kallie LocksStroud, Natalie M, FNP  atorvastatin (LIPITOR) 20 MG tablet Take 1 tablet (20 mg total) by mouth daily. 07/31/18 07/31/19  Mike Gipouglas, Andre, FNP  budesonide-formoterol (SYMBICORT) 80-4.5 MCG/ACT inhaler Inhale 2 puffs into the lungs 2 (two) times daily. 05/16/18   Kallie LocksStroud, Natalie M, FNP  cetirizine (ZYRTEC) 10 MG  tablet Take 1 tablet (10 mg total) by mouth daily. Patient not taking: Reported on 05/16/2018 04/15/18   Kallie LocksStroud, Natalie M, FNP  fluticasone [facility](FLONASE) 50 MCG/ACT nasal spray Place 2 sprays into both nostrils daily. 04/15/18   Kallie LocksStroud, Natalie M, FNP  guaiFENesin (MUCINEX) 600 MG 12 hr tablet Take 1 tablet (600 mg total) by mouth 2 (two) times daily. 05/16/18   Kallie LocksStroud, Natalie M, FNP  hydrocortisone cream 0.5 % Apply 1 application topically 2 (two) times daily. Patient not taking: Reported on 07/23/2018 05/16/18   Kallie LocksStroud, Natalie M, FNP  ketoconazole (NIZORAL) 2 % shampoo Apply 1 application topically 2 (two) times a week. 07/24/18   Mike Gipouglas, Andre, FNP  omeprazole (PRILOSEC) 20 MG capsule Take 1 capsule (20 mg total) by mouth 2 (two) times daily before a meal. 08/16/18   Benjiman CorePickering, Caelin Rosen, MD    Family History Family History  Problem Relation Age of Onset  . Asthma Mother   . Cancer Maternal Grandmother   . Cancer Maternal Grandfather     Social History Social History   Tobacco Use  . Smoking status: Former Smoker    Types: Cigarettes    Last attempt to quit: 01/19/2018    Years since quitting: 0.5  . Smokeless tobacco: Never Used  Substance Use Topics  . Alcohol use: Yes  . Drug use: Yes    Types:  Marijuana     Allergies   Patient has no known allergies.   Review of Systems Review of Systems  Constitutional: Negative for appetite change.  HENT: Negative for congestion.   Respiratory: Positive for cough, shortness of breath and wheezing.   Cardiovascular: Positive for chest pain.  Gastrointestinal: Negative for abdominal pain.  Endocrine: Negative for polyuria.  Genitourinary: Negative for flank pain.  Musculoskeletal: Negative for back pain.  Skin: Negative for rash.  Neurological: Negative for weakness.  Psychiatric/Behavioral: Negative for confusion.     Physical Exam Updated Vital Signs BP (!) 160/97 (BP Location: Right Wrist)   Pulse 85   Temp 98.2 F (36.8 C)  (Oral)   Resp 18   SpO2 98%   Physical Exam HENT:     Head: Normocephalic.  Neck:     Musculoskeletal: Neck supple.  Cardiovascular:     Rate and Rhythm: Normal rate and regular rhythm.  Pulmonary:     Comments: Mildly harsh breath sounds without focal rales or rhonchi. Abdominal:     Palpations: Abdomen is soft.     Tenderness: There is no abdominal tenderness.  Musculoskeletal:     Comments: Mild bilateral lower extremity pitting edema.  Skin:    General: Skin is warm.     Capillary Refill: Capillary refill takes less than 2 seconds.  Neurological:     General: No focal deficit present.     Mental Status: He is alert.      ED Treatments / Results  Labs (all labs ordered are listed, but only abnormal results are displayed) Labs Reviewed  BASIC METABOLIC PANEL - Abnormal; Notable for the following components:      Result Value   Glucose, Bld 111 (*)    All other components within normal limits  CBC  I-STAT TROPONIN, ED    EKG EKG Interpretation  Date/Time:  Saturday August 16 2018 01:38:28 EST Ventricular Rate:  84 PR Interval:  164 QRS Duration: 84 QT Interval:  354 QTC Calculation: 418 R Axis:   -5 Text Interpretation:  Normal sinus rhythm Minimal voltage criteria for LVH, may be normal variant Borderline ECG When compared with ECG of 03/30/2018, No significant change was found Confirmed by Dione BoozeGlick, David ([id number]) on 08/16/2018 5:19:23 AM Also confirmed by Benjiman CorePickering, Jaslyn Bansal [phone]  on 08/16/2018 7:22:43 AM   Radiology Dg Chest 2 View  Result Date: 08/16/2018 CLINICAL DATA:  Initial evaluation for acute chest pain. History of asthma. EXAM: CHEST - 2 VIEW COMPARISON:  Prior radiograph from 03/30/2018. FINDINGS: Cardiac and mediastinal silhouettes are stable in size and contour, and remain within normal limits. Lungs normally inflated. Chronic bronchitic changes, favored to be related history of asthma. No consolidative airspace disease. No pulmonary edema or pleural  effusion. No pneumothorax. No acute osseous abnormality. IMPRESSION: 1. Chronic bronchitic changes, similar to previous, and favored to be related history of asthma. 2. No other active cardiopulmonary disease. Electronically Signed   By: Rise MuBenjamin  McClintock M.D.   On: 08/16/2018 02:26    Procedures Procedures (including critical care time)  Medications Ordered in ED Medications - No data to display   Initial Impression / Assessment and Plan / ED Course  I have reviewed the triage vital signs and the nursing notes.  Pertinent labs & imaging results that were available during my care of the patient were reviewed by me and considered in my medical decision making (see chart for details).     Patient with chest pain.  I think it is  more likely of a GI source as it worsens with eating.  Not exertional.  X-ray reassuring.  EKG stable.  Troponin negative.  Discharged home with an increase of his Prilosec and GI follow-up.  Final Clinical Impressions(s) / ED Diagnoses   Final diagnoses:  Nonspecific chest pain    ED Discharge Orders         Ordered    omeprazole (PRILOSEC) 20 MG capsule  2 times daily before meals     08/16/18 0746           Benjiman Core, MD 08/16/18 [phone]

## 2018-08-16 NOTE — Discharge Instructions (Signed)
You likely need to see a gastroenterologist.  Follow-up with gastroenterology as listed above or your primary doctor can help provide 1 2.  We are increasing your Prilosec to twice a day.

## 2018-08-25 MED FILL — SYMBICORT 80-4.5 MCG INH: 80-4.5 | 30 days supply | Qty: 10 | Fill #3

## 2018-09-10 MED FILL — FLUTICASONE PROP 50 MCG SPR: 50 | 30 days supply | Qty: 16 | Fill #2

## 2018-09-18 MED FILL — SYMBICORT 80-4.5 MCG INH: 80-4.5 | 30 days supply | Qty: 10 | Fill #4

## 2018-09-19 MED FILL — FLUTICASONE PROP 50 MCG SPR: 50 | 30 days supply | Qty: 16 | Fill #2

## 2018-10-22 ENCOUNTER — Ambulatory Visit: Payer: Self-pay | Admitting: Family Medicine

## 2018-10-22 ENCOUNTER — Telehealth: Payer: Self-pay

## 2018-10-22 DIAGNOSIS — J4520 Mild intermittent asthma, uncomplicated: Secondary | ICD-10-CM

## 2018-10-22 MED ORDER — BUDESONIDE-FORMOTEROL FUMARATE 80-4.5 MCG/ACT IN AERO: 2.0000 | INHALATION_SPRAY | Freq: Two times a day (BID) | RESPIRATORY_TRACT | 6 refills | Status: DC

## 2018-10-22 MED FILL — SYMBICORT 80-4.5 MCG INH: 80-4.5 | 30 days supply | Qty: 10 | Fill #0

## 2018-10-22 NOTE — Telephone Encounter (Signed)
Patient is requesting the Omeprazole be sent to pharmacy and states that it was increased in ED.  Would you like me to send script or wait until follow up on 10/28/2018.

## 2018-10-23 ENCOUNTER — Other Ambulatory Visit: Payer: Self-pay | Admitting: Family Medicine

## 2018-10-23 DIAGNOSIS — K2100 Gastro-esophageal reflux disease with esophagitis, without bleeding: Secondary | ICD-10-CM

## 2018-10-23 MED ORDER — OMEPRAZOLE 20 MG PO CPDR: 20.0000 mg | DELAYED_RELEASE_CAPSULE | Freq: Two times a day (BID) | ORAL | 0 refills | Status: DC

## 2018-10-24 MED FILL — FLUTICASONE PROP 50 MCG SPR: 50 | 30 days supply | Qty: 16 | Fill #3

## 2018-10-24 NOTE — Telephone Encounter (Signed)
Informed patient omeprazole rx was sent to pharmacy and will be discussed at next appointment.

## 2018-10-28 ENCOUNTER — Other Ambulatory Visit: Payer: Self-pay

## 2018-10-28 ENCOUNTER — Ambulatory Visit (INDEPENDENT_AMBULATORY_CARE_PROVIDER_SITE_OTHER): Payer: BLUE CROSS/BLUE SHIELD | Admitting: Family Medicine

## 2018-10-28 ENCOUNTER — Encounter: Payer: Self-pay | Admitting: Family Medicine

## 2018-10-28 VITALS — BP 132/84 | HR 74 | Temp 98.0°F | Ht 69.0 in | Wt 278.0 lb

## 2018-10-28 DIAGNOSIS — M542 Cervicalgia: Secondary | ICD-10-CM | POA: Diagnosis not present

## 2018-10-28 DIAGNOSIS — Z09 Encounter for follow-up examination after completed treatment for conditions other than malignant neoplasm: Secondary | ICD-10-CM

## 2018-10-28 DIAGNOSIS — K2100 Gastro-esophageal reflux disease with esophagitis, without bleeding: Secondary | ICD-10-CM | POA: Insufficient documentation

## 2018-10-28 DIAGNOSIS — R0789 Other chest pain: Secondary | ICD-10-CM | POA: Diagnosis not present

## 2018-10-28 DIAGNOSIS — M25512 Pain in left shoulder: Secondary | ICD-10-CM | POA: Diagnosis not present

## 2018-10-28 NOTE — Progress Notes (Signed)
[facility]  Established Patient Office Visit  Subjective:  Patient ID: Cole Morales, male    DOB: [birth year]  Age: 43 y.o. MRN: [id number]  CC:  Chief Complaint  Patient presents with  . Follow-up    Upper chest cramps  . Hiatal Hernia    HPI Cole Morales is a 43 year old who presents for follow up today.   Past Medical History:  Diagnosis Date  . Asthma   . GERD (gastroesophageal reflux disease)    Current Status: Since his last office visit, he states that he has had left chest/side discomfort, which radiates to left neck and shoulder. He has been having chest discomfort intermittently, but lately more frequently. He denies fevers, chills, fatigue, recent infections, weight loss, and night sweats. He has not had any headaches, visual changes, dizziness, and falls. No chest pain, heart palpitations, cough and shortness of breath reported. No reports of GI problems such as nausea, vomiting, diarrhea, and constipation. He has no reports of blood in stools, dysuria and hematuria. No depression or anxiety reported. He denies pain today. He is accompanied by his mother today.   Past Surgical History:  Procedure Laterality Date  . MOUTH SURGERY      Family History  Problem Relation Age of Onset  . Asthma Mother   . Cancer Maternal Grandmother   . Cancer Maternal Grandfather     Social History   Socioeconomic History  . Marital status: Single    Spouse name: Not on file  . Number of children: Not on file  . Years of education: Not on file  . Highest education level: Not on file  Occupational History  . Not on file  Social Needs  . Financial resource strain: Not on file  . Food insecurity:    Worry: Not on file    Inability: Not on file  . Transportation needs:    Medical: Not on file    Non-medical: Not on file  Tobacco Use  . Smoking status: Former Smoker    Types: Cigarettes    Last attempt to quit: 01/19/2018     Years since quitting: 0.7  . Smokeless tobacco: Never Used  Substance and Sexual Activity  . Alcohol use: Yes  . Drug use: Yes    Types: Marijuana  . Sexual activity: Not on file  Lifestyle  . Physical activity:    Days per week: Not on file    Minutes per session: Not on file  . Stress: Not on file  Relationships  . Social connections:    Talks on phone: Not on file    Gets together: Not on file    Attends religious service: Not on file    Active member of club or organization: Not on file    Attends meetings of clubs or organizations: Not on file    Relationship status: Not on file  . Intimate partner violence:    Fear of current or ex partner: Not on file    Emotionally abused: Not on file    Physically abused: Not on file    Forced sexual activity: Not on file  Other Topics Concern  . Not on file  Social History Narrative  . Not on file    Outpatient Medications Prior to Visit  Medication Sig Dispense Refill  . albuterol (PROVENTIL HFA;VENTOLIN HFA) 108 (90 Base) MCG/ACT inhaler Inhale 1-2 puffs into the lungs every 6 (six) hours as needed. 1 Inhaler  11  . albuterol (PROVENTIL) (2.5 MG/3ML) 0.083% nebulizer solution Take 3 mLs (2.5 mg total) by nebulization every 6 (six) hours as needed for wheezing or shortness of breath. 75 mL 12  . budesonide-formoterol (SYMBICORT) 80-4.5 MCG/ACT inhaler Inhale 2 puffs into the lungs 2 (two) times daily. 1 Inhaler 6  . fluticasone (FLONASE) 50 MCG/ACT nasal spray Place 2 sprays into both nostrils daily. 16 g 6  . omeprazole (PRILOSEC) 20 MG capsule Take 1 capsule (20 mg total) by mouth 2 (two) times daily before a meal. 30 capsule 0  . atorvastatin (LIPITOR) 20 MG tablet Take 1 tablet (20 mg total) by mouth daily. (Patient not taking: Reported on 10/28/2018) 30 tablet 11  . cetirizine (ZYRTEC) 10 MG tablet Take 1 tablet (10 mg total) by mouth daily. (Patient not taking: Reported on 05/16/2018) 30 tablet 11  . guaiFENesin (MUCINEX) 600  MG 12 hr tablet Take 1 tablet (600 mg total) by mouth 2 (two) times daily. (Patient not taking: Reported on 10/28/2018) 30 tablet 1  . ketoconazole (NIZORAL) 2 % shampoo Apply 1 application topically 2 (two) times a week. (Patient not taking: Reported on 10/28/2018) 120 mL 0  . hydrocortisone cream 0.5 % Apply 1 application topically 2 (two) times daily. (Patient not taking: Reported on 07/23/2018) 30 g 3   No facility-administered medications prior to visit.     No Known Allergies  ROS Review of Systems  Constitutional: Negative.   HENT: Negative.   Eyes: Negative.   Respiratory: Negative.   Cardiovascular: Negative.   Gastrointestinal: Positive for abdominal distention (Obese).       GERD with pain radiating to his left neck and shoulder area.   Endocrine: Negative.   Genitourinary: Negative.   Musculoskeletal: Negative.   Skin: Negative.   Allergic/Immunologic: Negative.   Neurological: Negative.   Hematological: Negative.   Psychiatric/Behavioral: Negative.       Objective:    Physical Exam  Constitutional: He is oriented to person, place, and time. He appears well-developed and well-nourished.  HENT:  Head: Normocephalic and atraumatic.  Eyes: Conjunctivae are normal.  Neck: Normal range of motion. Neck supple.  Cardiovascular: Normal rate, regular rhythm, normal heart sounds and intact distal pulses.  Pulmonary/Chest: Effort normal and breath sounds normal.  Abdominal: Soft. Bowel sounds are normal.  Musculoskeletal: Normal range of motion.  Neurological: He is alert and oriented to person, place, and time. He has normal reflexes.  Skin: Skin is warm and dry.  Psychiatric: He has a normal mood and affect. His behavior is normal. Judgment and thought content normal.  Nursing note and vitals reviewed.   BP 132/84 (BP Location: Left Arm, Patient Position: Sitting, Cuff Size: Large)   Pulse 74   Temp 98 F (36.7 C) (Oral)   Ht  (1.753 m)   Wt 278 lb (126.1 kg)    SpO2 100%   BMI 41.05 kg/m  Wt Readings from Last 3 Encounters:  10/28/18 278 lb (126.1 kg)  07/23/18 281 lb (127.5 kg)  05/16/18 272 lb (123.4 kg)     Health Maintenance Due  Topic Date Due  . HIV Screening  03/05/1990    There are no preventive care reminders to display for this patient.  Lab Results  Component Value Date   TSH 2.430 07/23/2018   Lab Results  Component Value Date   WBC 6.5 08/16/2018   HGB 16.2 08/16/2018   HCT 48.0 08/16/2018   MCV 83.5 08/16/2018   PLT 215 08/16/2018  Lab Results  Component Value Date   NA 140 08/16/2018   K 3.9 08/16/2018   CO2 26 08/16/2018   GLUCOSE 111 (H) 08/16/2018   BUN 7 08/16/2018   CREATININE 0.92 08/16/2018   BILITOT 0.3 07/23/2018   ALKPHOS 88 07/23/2018   AST 32 07/23/2018   ALT 54 (H) 07/23/2018   PROT 7.3 07/23/2018   ALBUMIN 4.6 07/23/2018   CALCIUM 9.5 08/16/2018   ANIONGAP 9 08/16/2018   Lab Results  Component Value Date   CHOL 215 (H) 07/23/2018   Lab Results  Component Value Date   HDL 31 (L) 07/23/2018   Lab Results  Component Value Date   LDLCALC 143 (H) 07/23/2018   Lab Results  Component Value Date   TRIG 207 (H) 07/23/2018   Lab Results  Component Value Date   CHOLHDL 6.9 (H) 07/23/2018   Lab Results  Component Value Date   HGBA1C 5.8 (A) 04/15/2018    Assessment & Plan:   1. Gastroesophageal reflux disease with esophagitis We will obtain CT to evaluate for Hiatal Hernia.  - CT Abdomen Pelvis Wo Contrast; Future  2. Chest discomfort ECG is stable today.   3. Left shoulder pain, unspecified chronicity  4. Neck pain on left side Radiates to neck from chest discomfort.   5. Follow up He will follow up in 3 months.    Problem List Items Addressed This Visit    None    Visit Diagnoses    Gastroesophageal reflux disease with esophagitis    -  Primary   Relevant Orders   CT Abdomen Pelvis Wo Contrast   Chest discomfort       Left shoulder pain, unspecified  chronicity       Neck pain on left side       Follow up          No orders of the defined types were placed in this encounter.   Follow-up: Return in about 3 months (around 01/28/2019).    Kallie Locks, FNP

## 2018-11-03 ENCOUNTER — Other Ambulatory Visit: Payer: Self-pay | Admitting: Family Medicine

## 2018-11-03 DIAGNOSIS — K2100 Gastro-esophageal reflux disease with esophagitis, without bleeding: Secondary | ICD-10-CM

## 2018-11-04 ENCOUNTER — Other Ambulatory Visit: Payer: Self-pay | Admitting: Family Medicine

## 2018-11-04 DIAGNOSIS — K2100 Gastro-esophageal reflux disease with esophagitis, without bleeding: Secondary | ICD-10-CM

## 2018-11-14 MED FILL — SYMBICORT 80-4.5 MCG INH: 80-4.5 | 30 days supply | Qty: 10 | Fill #1

## 2018-11-27 ENCOUNTER — Telehealth: Payer: Self-pay

## 2018-11-27 ENCOUNTER — Ambulatory Visit (HOSPITAL_COMMUNITY): Admission: RE | Admit: 2018-11-27 | Payer: BLUE CROSS/BLUE SHIELD | Source: Ambulatory Visit

## 2018-11-27 NOTE — Telephone Encounter (Signed)
Tried to complete the authorization for patient CT. They need you to call [phone] to do a peer to peer. They need you to call as soon as possible to decide if they will cover the imaging. They are saying it doesn't meet medical necessity.

## 2018-11-27 NOTE — Telephone Encounter (Signed)
Left a vm for patient to callback 

## 2018-11-27 NOTE — Telephone Encounter (Signed)
Patient notified of appointment.  

## 2018-12-01 ENCOUNTER — Ambulatory Visit (HOSPITAL_COMMUNITY): Payer: BLUE CROSS/BLUE SHIELD

## 2018-12-01 ENCOUNTER — Ambulatory Visit (HOSPITAL_COMMUNITY): Admission: RE | Admit: 2018-12-01 | Payer: BLUE CROSS/BLUE SHIELD | Source: Ambulatory Visit

## 2018-12-02 ENCOUNTER — Ambulatory Visit (HOSPITAL_COMMUNITY)
Admission: RE | Admit: 2018-12-02 | Discharge: 2018-12-02 | Disposition: A | Discharge status: Home / Self Care | Payer: BLUE CROSS/BLUE SHIELD | Source: Ambulatory Visit | Attending: Family Medicine | Admitting: Family Medicine

## 2018-12-02 ENCOUNTER — Ambulatory Visit (HOSPITAL_COMMUNITY): Admission: RE | Admit: 2018-12-02 | Payer: BLUE CROSS/BLUE SHIELD | Source: Ambulatory Visit

## 2018-12-02 ENCOUNTER — Other Ambulatory Visit: Payer: Self-pay

## 2018-12-02 DIAGNOSIS — K2100 Gastro-esophageal reflux disease with esophagitis, without bleeding: Secondary | ICD-10-CM

## 2018-12-02 MED ORDER — SODIUM CHLORIDE (PF) 0.9 % IJ SOLN
INTRAMUSCULAR | Status: AC
  Filled 2018-12-02: qty 50

## 2018-12-02 MED ORDER — IOHEXOL 300 MG/ML  SOLN
100.0000 mL | Freq: Once | INTRAMUSCULAR | Status: AC | PRN
  Administered 2018-12-02: 100 mL via INTRAVENOUS

## 2018-12-08 ENCOUNTER — Other Ambulatory Visit: Payer: Self-pay

## 2018-12-08 DIAGNOSIS — Z6838 Body mass index (BMI) 38.0-38.9, adult: Principal | ICD-10-CM

## 2018-12-08 DIAGNOSIS — E785 Hyperlipidemia, unspecified: Secondary | ICD-10-CM

## 2018-12-08 MED ORDER — ATORVASTATIN CALCIUM 20 MG PO TABS: 20.0000 mg | ORAL_TABLET | Freq: Every day | ORAL | 3 refills | Status: DC

## 2018-12-15 MED FILL — SYMBICORT 80-4.5 MCG INH: 80-4.5 | 30 days supply | Qty: 10 | Fill #2

## 2019-01-07 ENCOUNTER — Telehealth: Payer: Self-pay

## 2019-01-07 ENCOUNTER — Other Ambulatory Visit: Payer: Self-pay | Admitting: Family Medicine

## 2019-01-07 DIAGNOSIS — K0889 Other specified disorders of teeth and supporting structures: Secondary | ICD-10-CM

## 2019-01-07 DIAGNOSIS — K047 Periapical abscess without sinus: Secondary | ICD-10-CM

## 2019-01-07 MED ORDER — AMOXICILLIN 500 MG PO CAPS: 500.0000 mg | ORAL_CAPSULE | Freq: Three times a day (TID) | ORAL | 0 refills | Status: DC

## 2019-01-07 MED ORDER — IBUPROFEN 800 MG PO TABS: 800.0000 mg | ORAL_TABLET | Freq: Three times a day (TID) | ORAL | 3 refills | Status: DC | PRN

## 2019-01-07 NOTE — Telephone Encounter (Signed)
Left a message for patient to call back. 

## 2019-01-07 NOTE — Telephone Encounter (Signed)
Left a vm for patient to callback 

## 2019-01-07 NOTE — Telephone Encounter (Signed)
Patient has a tooth abscess and would like to have something sent in. Patient hasn't been able to find a dentist that can get him in yet. Patient would like to have sent to [facility] in [place].

## 2019-01-08 NOTE — Telephone Encounter (Signed)
Tried to contact patient no answer 

## 2019-01-15 MED FILL — SYMBICORT 80-4.5 MCG INH: 80-4.5 | 30 days supply | Qty: 10 | Fill #3

## 2019-01-21 ENCOUNTER — Emergency Department (HOSPITAL_COMMUNITY)
Admission: EM | Admit: 2019-01-21 | Discharge: 2019-01-21 | Disposition: A | Discharge status: Home / Self Care | Payer: BLUE CROSS/BLUE SHIELD | Attending: Emergency Medicine | Admitting: Emergency Medicine

## 2019-01-21 ENCOUNTER — Telehealth: Payer: Self-pay

## 2019-01-21 ENCOUNTER — Other Ambulatory Visit: Payer: Self-pay

## 2019-01-21 ENCOUNTER — Emergency Department (HOSPITAL_COMMUNITY): Payer: BLUE CROSS/BLUE SHIELD

## 2019-01-21 DIAGNOSIS — Z87891 Personal history of nicotine dependence: Secondary | ICD-10-CM | POA: Diagnosis not present

## 2019-01-21 DIAGNOSIS — Y939 Activity, unspecified: Secondary | ICD-10-CM | POA: Insufficient documentation

## 2019-01-21 DIAGNOSIS — F1290 Cannabis use, unspecified, uncomplicated: Secondary | ICD-10-CM | POA: Insufficient documentation

## 2019-01-21 DIAGNOSIS — Y929 Unspecified place or not applicable: Secondary | ICD-10-CM | POA: Diagnosis not present

## 2019-01-21 DIAGNOSIS — W208XXA Other cause of strike by thrown, projected or falling object, initial encounter: Secondary | ICD-10-CM | POA: Insufficient documentation

## 2019-01-21 DIAGNOSIS — J45901 Unspecified asthma with (acute) exacerbation: Secondary | ICD-10-CM | POA: Diagnosis not present

## 2019-01-21 DIAGNOSIS — Y999 Unspecified external cause status: Secondary | ICD-10-CM | POA: Diagnosis not present

## 2019-01-21 DIAGNOSIS — Z79899 Other long term (current) drug therapy: Secondary | ICD-10-CM | POA: Diagnosis not present

## 2019-01-21 DIAGNOSIS — R0602 Shortness of breath: Secondary | ICD-10-CM | POA: Diagnosis present

## 2019-01-21 DIAGNOSIS — S20212A Contusion of left front wall of thorax, initial encounter: Secondary | ICD-10-CM | POA: Diagnosis not present

## 2019-01-21 LAB — CBG MONITORING, ED: Glucose-Capillary: 98 mg/dL (ref 70–99)

## 2019-01-21 MED ORDER — ALBUTEROL SULFATE HFA 108 (90 BASE) MCG/ACT IN AERS: 1.0000 | INHALATION_SPRAY | Freq: Four times a day (QID) | RESPIRATORY_TRACT | 0 refills | Status: DC | PRN

## 2019-01-21 MED ORDER — PREDNISONE 10 MG PO TABS: ORAL_TABLET | ORAL | 0 refills | Status: DC

## 2019-01-21 NOTE — ED Notes (Signed)
Patient transported to X-ray 
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
## 2019-01-21 NOTE — Telephone Encounter (Signed)
Left a vm for patient to callback 

## 2019-01-21 NOTE — Discharge Instructions (Signed)
Discuss sheduling sleep studies with your provider.

## 2019-01-27 NOTE — Telephone Encounter (Signed)
Left several messages and have been unable to reach patient by phone

## 2019-01-28 ENCOUNTER — Encounter: Payer: Self-pay | Admitting: Family Medicine

## 2019-01-28 ENCOUNTER — Ambulatory Visit (INDEPENDENT_AMBULATORY_CARE_PROVIDER_SITE_OTHER): Payer: BLUE CROSS/BLUE SHIELD | Admitting: Family Medicine

## 2019-01-28 ENCOUNTER — Other Ambulatory Visit: Payer: Self-pay

## 2019-01-28 VITALS — BP 142/92 | HR 78 | Temp 98.4°F | Ht 69.0 in | Wt 283.3 lb

## 2019-01-28 DIAGNOSIS — G4730 Sleep apnea, unspecified: Secondary | ICD-10-CM | POA: Diagnosis not present

## 2019-01-28 DIAGNOSIS — Z131 Encounter for screening for diabetes mellitus: Secondary | ICD-10-CM | POA: Diagnosis not present

## 2019-01-28 DIAGNOSIS — E66812 Obesity, class 2: Secondary | ICD-10-CM

## 2019-01-28 DIAGNOSIS — K047 Periapical abscess without sinus: Secondary | ICD-10-CM

## 2019-01-28 DIAGNOSIS — Z09 Encounter for follow-up examination after completed treatment for conditions other than malignant neoplasm: Secondary | ICD-10-CM

## 2019-01-28 DIAGNOSIS — Z6838 Body mass index (BMI) 38.0-38.9, adult: Secondary | ICD-10-CM

## 2019-01-28 DIAGNOSIS — M62838 Other muscle spasm: Secondary | ICD-10-CM | POA: Diagnosis not present

## 2019-01-28 LAB — POCT URINALYSIS DIP (MANUAL ENTRY)
Bilirubin, UA: NEGATIVE
Blood, UA: NEGATIVE
Glucose, UA: NEGATIVE mg/dL
Ketones, POC UA: NEGATIVE mg/dL
Leukocytes, UA: NEGATIVE
Nitrite, UA: NEGATIVE
Protein Ur, POC: NEGATIVE mg/dL
Spec Grav, UA: 1.015 (ref 1.010–1.025)
Urobilinogen, UA: 0.2 E.U./dL
pH, UA: 6 (ref 5.0–8.0)

## 2019-01-28 LAB — POCT GLYCOSYLATED HEMOGLOBIN (HGB A1C): Hemoglobin A1C: 6.7 % — AB (ref 4.0–5.6)

## 2019-01-28 MED ORDER — CYCLOBENZAPRINE HCL 10 MG PO TABS: 10.0000 mg | ORAL_TABLET | Freq: Three times a day (TID) | ORAL | 3 refills | Status: DC | PRN

## 2019-01-28 NOTE — Progress Notes (Signed)
[name]    Established Patient Office Visit  Subjective:  Patient ID: Cole Morales, male    DOB: 1976-02-17  Age: 43 y.o. MRN: [id number]  CC:  Chief Complaint  Patient presents with  . Follow-up    chronic condition     HPI Cole Morales is a 43 year old male who presents for follow up today.   Past Medical History:  Diagnosis Date  . Asthma   . GERD (gastroesophageal reflux disease)    Current Status: Since his last office visit, he has had an ED visit for Asthma. Today, he is doing well with no complaints. He has tooth abscess, and is scheduled for dental appointment in 1 month. He denies fevers, chills, fatigue, recent infections, weight loss, and night sweats. He has not had any headaches, visual changes, dizziness, and falls. No chest pain, heart palpitations, cough and shortness of breath reported. No reports of GI problems such as nausea, vomiting, diarrhea, and constipation. He has no reports of blood in stools, dysuria and hematuria. No depression or anxiety reported.    Past Surgical History:  Procedure Laterality Date  . MOUTH SURGERY      Family History  Problem Relation Age of Onset  . Asthma Mother   . Cancer Maternal Grandmother   . Cancer Maternal Grandfather     Social History   Socioeconomic History  . Marital status: Single    Spouse name: Not on file  . Number of children: Not on file  . Years of education: Not on file  . Highest education level: Not on file  Occupational History  . Not on file  Social Needs  . Financial resource strain: Not on file  . Food insecurity    Worry: Not on file    Inability: Not on file  . Transportation needs    Medical: Not on file    Non-medical: Not on file  Tobacco Use  . Smoking status: Former Smoker    Types: Cigarettes    Quit date: 01/19/2018    Years since quitting: 1.0  . Smokeless tobacco: Never Used  Substance and Sexual Activity  .  Alcohol use: Yes  . Drug use: Yes    Types: Marijuana  . Sexual activity: Not on file  Lifestyle  . Physical activity    Days per week: Not on file    Minutes per session: Not on file  . Stress: Not on file  Relationships  . Social Herbalist on phone: Not on file    Gets together: Not on file    Attends religious service: Not on file    Active member of club or organization: Not on file    Attends meetings of clubs or organizations: Not on file    Relationship status: Not on file  . Intimate partner violence    Fear of current or ex partner: Not on file    Emotionally abused: Not on file    Physically abused: Not on file    Forced sexual activity: Not on file  Other Topics Concern  . Not on file  Social History Narrative  . Not on file    Outpatient Medications Prior to Visit  Medication Sig Dispense Refill  . albuterol (VENTOLIN HFA) 108 (90 Base) MCG/ACT inhaler Inhale 1-2 puffs into the lungs every 6 (six) hours as needed for wheezing or shortness of breath. 1 Inhaler 0  . atorvastatin (LIPITOR) 20 MG  tablet Take 1 tablet (20 mg total) by mouth daily. 30 tablet 3  . budesonide-formoterol (SYMBICORT) 80-4.5 MCG/ACT inhaler Inhale 2 puffs into the lungs 2 (two) times daily. 1 Inhaler 6  . fluticasone (FLONASE) 50 MCG/ACT nasal spray Place 2 sprays into both nostrils daily. 16 g 6  . ibuprofen (ADVIL) 800 MG tablet Take 1 tablet (800 mg total) by mouth every 8 (eight) hours as needed. 30 tablet 3  . omeprazole (PRILOSEC) 20 MG capsule Take 1 capsule (20 mg total) by mouth 2 (two) times daily before a meal. 30 capsule 0  . amoxicillin (AMOXIL) 500 MG capsule Take 1 capsule (500 mg total) by mouth 3 (three) times daily. 30 capsule 0  . predniSONE (DELTASONE) 10 MG tablet Taper dosage 6,5,4,3 2,1 21 tablet 0  . cetirizine (ZYRTEC) 10 MG tablet Take 1 tablet (10 mg total) by mouth daily. (Patient not taking: Reported on 05/16/2018) 30 tablet 11  . guaiFENesin (MUCINEX)  600 MG 12 hr tablet Take 1 tablet (600 mg total) by mouth 2 (two) times daily. (Patient not taking: Reported on 10/28/2018) 30 tablet 1  . ketoconazole (NIZORAL) 2 % shampoo Apply 1 application topically 2 (two) times a week. (Patient not taking: Reported on 10/28/2018) 120 mL 0   No facility-administered medications prior to visit.     No Known Allergies  ROS Review of Systems  Constitutional: Negative.   HENT: Negative.   Eyes: Negative.   Respiratory: Negative.   Cardiovascular: Negative.   Gastrointestinal: Negative.   Endocrine: Negative.   Genitourinary: Negative.   Musculoskeletal: Negative.   Skin: Negative.   Allergic/Immunologic: Negative.   Neurological: Negative.   Hematological: Negative.   Psychiatric/Behavioral: Negative.       Objective:    Physical Exam  Constitutional: He is oriented to person, place, and time. He appears well-developed and well-nourished.  HENT:  Head: Normocephalic and atraumatic.  Eyes: Conjunctivae are normal.  Neck: Normal range of motion. Neck supple.  Cardiovascular: Normal rate, regular rhythm, normal heart sounds and intact distal pulses.  Pulmonary/Chest: Effort normal and breath sounds normal.  Abdominal: Soft. Bowel sounds are normal.  Musculoskeletal: Normal range of motion.  Neurological: He is alert and oriented to person, place, and time. He has normal reflexes.  Skin: Skin is warm.  Psychiatric: He has a normal mood and affect. His behavior is normal. Judgment and thought content normal.  Nursing note and vitals reviewed.   BP (!) 142/92   Pulse 78   Temp 98.4 F (36.9 C) (Oral)   Ht 5\' 9"  (1.753 m)   Wt 283 lb 4.8 oz (128.5 kg)   SpO2 99%   BMI 41.84 kg/m  Wt Readings from Last 3 Encounters:  01/28/19 283 lb 4.8 oz (128.5 kg)  01/21/19 278 lb (126.1 kg)  10/28/18 278 lb (126.1 kg)     Health Maintenance Due  Topic Date Due  . HIV Screening  03/05/1990    There are no preventive care reminders to  display for this patient.  Lab Results  Component Value Date   TSH 2.430 07/23/2018   Lab Results  Component Value Date   WBC 6.5 08/16/2018   HGB 16.2 08/16/2018   HCT 48.0 08/16/2018   MCV 83.5 08/16/2018   PLT 215 08/16/2018   Lab Results  Component Value Date   NA 140 08/16/2018   K 3.9 08/16/2018   CO2 26 08/16/2018   GLUCOSE 111 (H) 08/16/2018   BUN 7 08/16/2018  CREATININE 0.92 08/16/2018   BILITOT 0.3 07/23/2018   ALKPHOS 88 07/23/2018   AST 32 07/23/2018   ALT 54 (H) 07/23/2018   PROT 7.3 07/23/2018   ALBUMIN 4.6 07/23/2018   CALCIUM 9.5 08/16/2018   ANIONGAP 9 08/16/2018   Lab Results  Component Value Date   CHOL 215 (H) 07/23/2018   Lab Results  Component Value Date   HDL 31 (L) 07/23/2018   Lab Results  Component Value Date   LDLCALC 143 (H) 07/23/2018   Lab Results  Component Value Date   TRIG 207 (H) 07/23/2018   Lab Results  Component Value Date   CHOLHDL 6.9 (H) 07/23/2018   Lab Results  Component Value Date   HGBA1C 6.7 (A) 01/28/2019      Assessment & Plan:   1. Class 2 severe obesity due to excess calories with serious comorbidity and body mass index (BMI) of 38.0 to 38.9 in adult [facility](HCC) Body mass index is 41.84 kg/m. Goal BMI  is <30. Encouraged efforts to reduce weight include engaging in physical activity as tolerated with goal of 150 minutes per week. Improve dietary choices and eat a meal regimen consistent with a Mediterranean or DASH diet. Reduce simple carbohydrates. Do not skip meals and eat healthy snacks throughout the day to avoid over-eating at dinner. Set a goal weight loss that is achievable for you.  2. Muscle spasm  3. Tooth abscess  4. Sleep apnea, unspecified type We will refer him for home sleep apnea test.  - Home sleep test - cyclobenzaprine (FLEXERIL) 10 MG tablet; Take 1 tablet (10 mg total) by mouth 3 (three) times daily as needed for muscle spasms.  Dispense: 30 tablet; Refill: 3  5. Screening for  diabetes mellitus - POCT glycosylated hemoglobin (Hb A1C) - POCT urinalysis dipstick  6. Follow up He will follow up in 6 months.   Meds ordered this encounter  Medications  . cyclobenzaprine (FLEXERIL) 10 MG tablet    Sig: Take 1 tablet (10 mg total) by mouth 3 (three) times daily as needed for muscle spasms.    Dispense:  30 tablet    Refill:  3    Orders Placed This Encounter  Procedures  . POCT glycosylated hemoglobin (Hb A1C)  . POCT urinalysis dipstick  . Home sleep test   Referral Orders  No referral(s) requested today    Raliegh IpNatalie Traci Gafford,  MSN, FNP-BC [facility] [address]  , [place] [id number] [phone]   Problem List Items Addressed This Visit      Respiratory   Sleep apnea   Relevant Medications   cyclobenzaprine (FLEXERIL) 10 MG tablet   Other Relevant Orders   Home sleep test     Other   Muscle spasm    Other Visit Diagnoses    Class 2 severe obesity due to excess calories with serious comorbidity and body mass index (BMI) of 38.0 to 38.9 in adult [facility](HCC)    -  Primary   Tooth abscess       Screening for diabetes mellitus       Relevant Orders   POCT glycosylated hemoglobin (Hb A1C) (Completed)   POCT urinalysis dipstick (Completed)   Follow up          Meds ordered this encounter  Medications  . cyclobenzaprine (FLEXERIL) 10 MG tablet    Sig: Take 1 tablet (10 mg total) by mouth 3 (three) times daily as needed for muscle spasms.  Dispense:  30 tablet    Refill:  3    Follow-up: Return in about 6 months (around 07/30/2019).    Kallie LocksNatalie M Sugar Vanzandt, FNP

## 2019-01-28 NOTE — Patient Instructions (Addendum)
DASH Eating Plan DASH stands for "Dietary Approaches to Stop Hypertension." The DASH eating plan is a healthy eating plan that has been shown to reduce high blood pressure (hypertension). It may also reduce your risk for type 2 diabetes, heart disease, and stroke. The DASH eating plan may also help with weight loss. What are tips for following this plan?  General guidelines  Avoid eating more than 2,300 mg (milligrams) of salt (sodium) a day. If you have hypertension, you may need to reduce your sodium intake to 1,500 mg a day.  Limit alcohol intake to no more than 1 drink a day for nonpregnant women and 2 drinks a day for men. One drink equals 12 oz of beer, 5 oz of wine, or 1 oz of hard liquor.  Work with your health care provider to maintain a healthy body weight or to lose weight. Ask what an ideal weight is for you.  Get at least 30 minutes of exercise that causes your heart to beat faster (aerobic exercise) most days of the week. Activities may include walking, swimming, or biking.  Work with your health care provider or diet and nutrition specialist (dietitian) to adjust your eating plan to your individual calorie needs. Reading food labels   Check food labels for the amount of sodium per serving. Choose foods with less than 5 percent of the Daily Value of sodium. Generally, foods with less than 300 mg of sodium per serving fit into this eating plan.  To find whole grains, look for the word "whole" as the first word in the ingredient list. Shopping  Buy products labeled as "low-sodium" or "no salt added."  Buy fresh foods. Avoid canned foods and premade or frozen meals. Cooking  Avoid adding salt when cooking. Use salt-free seasonings or herbs instead of table salt or sea salt. Check with your health care provider or pharmacist before using salt substitutes.  Do not fry foods. Cook foods using healthy methods such as baking, boiling, grilling, and broiling instead.  Cook with  heart-healthy oils, such as olive, canola, soybean, or sunflower oil. Meal planning  Eat a balanced diet that includes: ? 5 or more servings of fruits and vegetables each day. At each meal, try to fill half of your plate with fruits and vegetables. ? Up to 6-8 servings of whole grains each day. ? Less than 6 oz of lean meat, poultry, or fish each day. A 3-oz serving of meat is about the same size as a deck of cards. One egg equals 1 oz. ? 2 servings of low-fat dairy each day. ? A serving of nuts, seeds, or beans 5 times each week. ? Heart-healthy fats. Healthy fats called Omega-3 fatty acids are found in foods such as flaxseeds and coldwater fish, like sardines, salmon, and mackerel.  Limit how much you eat of the following: ? Canned or prepackaged foods. ? Food that is high in trans fat, such as fried foods. ? Food that is high in saturated fat, such as fatty meat. ? Sweets, desserts, sugary drinks, and other foods with added sugar. ? Full-fat dairy products.  Do not salt foods before eating.  Try to eat at least 2 vegetarian meals each week.  Eat more home-cooked food and less restaurant, buffet, and fast food.  When eating at a restaurant, ask that your food be prepared with less salt or no salt, if possible. What foods are recommended? The items listed may not be a complete list. Talk with your dietitian about   what dietary choices are best for you. Grains Whole-grain or whole-wheat bread. Whole-grain or whole-wheat pasta. Brown rice. Oatmeal. Quinoa. Bulgur. Whole-grain and low-sodium cereals. Pita bread. Low-fat, low-sodium crackers. Whole-wheat flour tortillas. Vegetables Fresh or frozen vegetables (raw, steamed, roasted, or grilled). Low-sodium or reduced-sodium tomato and vegetable juice. Low-sodium or reduced-sodium tomato sauce and tomato paste. Low-sodium or reduced-sodium canned vegetables. Fruits All fresh, dried, or frozen fruit. Canned fruit in natural juice (without  added sugar). Meat and other protein foods Skinless chicken or turkey. Ground chicken or turkey. Pork with fat trimmed off. Fish and seafood. Egg whites. Dried beans, peas, or lentils. Unsalted nuts, nut butters, and seeds. Unsalted canned beans. Lean cuts of beef with fat trimmed off. Low-sodium, lean deli meat. Dairy Low-fat (1%) or fat-free (skim) milk. Fat-free, low-fat, or reduced-fat cheeses. Nonfat, low-sodium ricotta or cottage cheese. Low-fat or nonfat yogurt. Low-fat, low-sodium cheese. Fats and oils Soft margarine without trans fats. Vegetable oil. Low-fat, reduced-fat, or light mayonnaise and salad dressings (reduced-sodium). Canola, safflower, olive, soybean, and sunflower oils. Avocado. Seasoning and other foods Herbs. Spices. Seasoning mixes without salt. Unsalted popcorn and pretzels. Fat-free sweets. What foods are not recommended? The items listed may not be a complete list. Talk with your dietitian about what dietary choices are best for you. Grains Baked goods made with fat, such as croissants, muffins, or some breads. Dry pasta or rice meal packs. Vegetables Creamed or fried vegetables. Vegetables in a cheese sauce. Regular canned vegetables (not low-sodium or reduced-sodium). Regular canned tomato sauce and paste (not low-sodium or reduced-sodium). Regular tomato and vegetable juice (not low-sodium or reduced-sodium). Pickles. Olives. Fruits Canned fruit in a light or heavy syrup. Fried fruit. Fruit in cream or butter sauce. Meat and other protein foods Fatty cuts of meat. Ribs. Fried meat. Bacon. Sausage. Bologna and other processed lunch meats. Salami. Fatback. Hotdogs. Bratwurst. Salted nuts and seeds. Canned beans with added salt. Canned or smoked fish. Whole eggs or egg yolks. Chicken or turkey with skin. Dairy Whole or 2% milk, cream, and half-and-half. Whole or full-fat cream cheese. Whole-fat or sweetened yogurt. Full-fat cheese. Nondairy creamers. Whipped toppings.  Processed cheese and cheese spreads. Fats and oils Butter. Stick margarine. Lard. Shortening. Ghee. Bacon fat. Tropical oils, such as coconut, palm kernel, or palm oil. Seasoning and other foods Salted popcorn and pretzels. Onion salt, garlic salt, seasoned salt, table salt, and sea salt. Worcestershire sauce. Tartar sauce. Barbecue sauce. Teriyaki sauce. Soy sauce, including reduced-sodium. Steak sauce. Canned and packaged gravies. Fish sauce. Oyster sauce. Cocktail sauce. Horseradish that you find on the shelf. Ketchup. Mustard. Meat flavorings and tenderizers. Bouillon cubes. Hot sauce and Tabasco sauce. Premade or packaged marinades. Premade or packaged taco seasonings. Relishes. Regular salad dressings. Where to find more information:  National Heart, Lung, and Blood Institute: www.nhlbi.nih.gov  American Heart Association: www.heart.org Summary  The DASH eating plan is a healthy eating plan that has been shown to reduce high blood pressure (hypertension). It may also reduce your risk for type 2 diabetes, heart disease, and stroke.  With the DASH eating plan, you should limit salt (sodium) intake to 2,300 mg a day. If you have hypertension, you may need to reduce your sodium intake to 1,500 mg a day.  When on the DASH eating plan, aim to eat more fresh fruits and vegetables, whole grains, lean proteins, low-fat dairy, and heart-healthy fats.  Work with your health care provider or diet and nutrition specialist (dietitian) to adjust your eating plan to your   individual calorie needs. This information is not intended to replace advice given to you by your health care provider. Make sure you discuss any questions you have with your health care provider. Document Released: 07/19/2011 Document Revised: 07/23/2016 Document Reviewed: 07/23/2016 Elsevier Interactive Patient Education  2019 Elsevier Inc. Cyclobenzaprine tablets What is this medicine? CYCLOBENZAPRINE (sye kloe BEN za preen) is a  muscle relaxer. It is used to treat muscle pain, spasms, and stiffness. This medicine may be used for other purposes; ask your health care provider or pharmacist if you have questions. COMMON BRAND NAME(S): Fexmid, Flexeril What should I tell my health care provider before I take this medicine? They need to know if you have any of these conditions: -heart disease, irregular heartbeat, or previous heart attack -liver disease -thyroid problem -an unusual or allergic reaction to cyclobenzaprine, tricyclic antidepressants, lactose, other medicines, foods, dyes, or preservatives -pregnant or trying to get pregnant -breast-feeding How should I use this medicine? Take this medicine by mouth with a glass of water. Follow the directions on the prescription label. If this medicine upsets your stomach, take it with food or milk. Take your medicine at regular intervals. Do not take it more often than directed. Talk to your pediatrician regarding the use of this medicine in children. Special care may be needed. Overdosage: If you think you have taken too much of this medicine contact a poison control center or emergency room at once. NOTE: This medicine is only for you. Do not share this medicine with others. What if I miss a dose? If you miss a dose, take it as soon as you can. If it is almost time for your next dose, take only that dose. Do not take double or extra doses. What may interact with this medicine? Do not take this medicine with any of the following medications: -MAOIs like Carbex, Eldepryl, Marplan, Nardil, and Parnate This medicine may also interact with the following medications: -alcohol -antihistamines for allergy, cough, and cold -certain medicines for anxiety or sleep -certain medicines for depression like amitriptyline, fluoxetine, sertraline -certain medicines for seizures like phenobarbital, primidone -contrast dyes -local anesthetics like lidocaine, pramoxine, tetracaine  -medicines that relax muscles for surgery -narcotic medicines for pain -phenothiazines like chlorpromazine, mesoridazine, prochlorperazine This list may not describe all possible interactions. Give your health care provider a list of all the medicines, herbs, non-prescription drugs, or dietary supplements you use. Also tell them if you smoke, drink alcohol, or use illegal drugs. Some items may interact with your medicine. What should I watch for while using this medicine? Tell your doctor or health care professional if your symptoms do not start to get better or if they get worse. You may get drowsy or dizzy. Do not drive, use machinery, or do anything that needs mental alertness until you know how this medicine affects you. Do not stand or sit up quickly, especially if you are an older patient. This reduces the risk of dizzy or fainting spells. Alcohol may interfere with the effect of this medicine. Avoid alcoholic drinks. If you are taking another medicine that also causes drowsiness, you may have more side effects. Give your health care provider a list of all medicines you use. Your doctor will tell you how much medicine to take. Do not take more medicine than directed. Call emergency for help if you have problems breathing or unusual sleepiness. Your mouth may get dry. Chewing sugarless gum or sucking hard candy, and drinking plenty of water may help. Contact your  doctor if the problem does not go away or is severe. What side effects may I notice from receiving this medicine? Side effects that you should report to your doctor or health care professional as soon as possible: -allergic reactions like skin rash, itching or hives, swelling of the face, lips, or tongue -breathing problems -chest pain -fast, irregular heartbeat -hallucinations -seizures -unusually weak or tired Side effects that usually do not require medical attention (report to your doctor or health care professional if they  continue or are bothersome): -headache -nausea, vomiting This list may not describe all possible side effects. Call your doctor for medical advice about side effects. You may report side effects to FDA at 1-800-FDA-1088. Where should I keep my medicine? Keep out of the reach of children. Store at room temperature between 15 and 30 degrees C (59 and 86 degrees F). Keep container tightly closed. Throw away any unused medicine after the expiration date. NOTE: This sheet is a summary. It may not cover all possible information. If you have questions about this medicine, talk to your doctor, pharmacist, or health care provider.  2019 Elsevier/Gold Standard (2017-05-22 13:04:35) Muscle Cramps and Spasms Muscle cramps and spasms are when muscles tighten by themselves. They usually get better within minutes. Muscle cramps are painful. They are usually stronger and last longer than muscle spasms. Muscle spasms may or may not be painful. They can last a few seconds or much longer. Cramps and spasms can affect any muscle, but they occur most often in the calf muscles of the leg. They are usually not caused by a serious problem. In many cases, the cause is not known. Some common causes include:  Doing more physical work or exercise than your body is ready for.  Using the muscles too much (overuse) by repeating certain movements too many times.  Staying in a certain position for a long time.  Playing a sport or doing an activity without preparing properly.  Using bad form or technique while playing a sport or doing an activity.  Not having enough water in your body (dehydration).  Injury.  Side effects of some medicines.  Low levels of the salts and minerals in your blood (electrolytes), such as low potassium or calcium. Follow these instructions at home: Managing pain and stiffness      Massage, stretch, and relax the muscle. Do this for many minutes at a time.  If told, put heat on tight  or tense muscles as often as told by your doctor. Use the heat source that your doctor recommends, such as a moist heat pack or a heating pad. ? Place a towel between your skin and the heat source. ? Leave the heat on for 20-30 minutes. ? Remove the heat if your skin turns bright red. This is very important if you are not able to feel pain, heat, or cold. You may have a greater risk of getting burned.  If told, put ice on the affected area. This may help if you are sore or have pain after a cramp or spasm. ? Put ice in a plastic bag. ? Place a towel between your skin and the bag. ? Leave the ice on for 20 minutes, 2-3 times a day.  Try taking hot showers or baths to help relax tight muscles. Eating and drinking  Drink enough fluid to keep your pee (urine) pale yellow.  Eat a healthy diet to help ensure that your muscles work well. This should include: ? Fruits and vegetables. ?  Lean protein. ? Whole grains. ? Low-fat or nonfat dairy products. General instructions  If you are having cramps often, avoid intense exercise for several days.  Take over-the-counter and prescription medicines only as told by your doctor.  Watch for any changes in your symptoms.  Keep all follow-up visits as told by your doctor. This is important. Contact a doctor if:  Your cramps or spasms get worse or happen more often.  Your cramps or spasms do not get better with time. Summary  Muscle cramps and spasms are when muscles tighten by themselves. They usually get better within minutes.  Cramps and spasms occur most often in the calf muscles of the leg.  Massage, stretch, and relax the muscle. This may help the cramp or spasm go away.  Drink enough fluid to keep your pee (urine) pale yellow. This information is not intended to replace advice given to you by your health care provider. Make sure you discuss any questions you have with your health care provider. Document Released: 07/12/2008 Document  Revised: 12/23/2017 Document Reviewed: 12/23/2017 Elsevier Interactive Patient Education  2019 [employer]

## 2019-01-29 DIAGNOSIS — M62838 Other muscle spasm: Secondary | ICD-10-CM | POA: Insufficient documentation

## 2019-01-29 DIAGNOSIS — G4730 Sleep apnea, unspecified: Secondary | ICD-10-CM | POA: Insufficient documentation

## 2019-02-25 MED FILL — SYMBICORT 80-4.5 MCG INH: 80-4.5 | 30 days supply | Qty: 10 | Fill #4

## 2019-03-30 MED FILL — SYMBICORT 80-4.5 MCG INH: 80-4.5 | 30 days supply | Qty: 10 | Fill #5

## 2019-04-06 ENCOUNTER — Ambulatory Visit (HOSPITAL_BASED_OUTPATIENT_CLINIC_OR_DEPARTMENT_OTHER): Payer: BLUE CROSS/BLUE SHIELD | Attending: Family Medicine | Admitting: Internal Medicine

## 2019-04-06 ENCOUNTER — Other Ambulatory Visit: Payer: Self-pay

## 2019-04-06 DIAGNOSIS — G4730 Sleep apnea, unspecified: Secondary | ICD-10-CM | POA: Insufficient documentation

## 2019-04-07 ENCOUNTER — Other Ambulatory Visit: Payer: Self-pay | Admitting: Family Medicine

## 2019-04-07 DIAGNOSIS — Z6838 Body mass index (BMI) 38.0-38.9, adult: Secondary | ICD-10-CM

## 2019-04-07 DIAGNOSIS — E785 Hyperlipidemia, unspecified: Secondary | ICD-10-CM

## 2019-04-14 DIAGNOSIS — G4730 Sleep apnea, unspecified: Secondary | ICD-10-CM | POA: Diagnosis not present

## 2019-04-14 NOTE — Procedures (Signed)
    Patient Name: Cole Morales, Cole Morales Date: 04/06/2019 Gender: Male D.O.B: 08/30/1975 Age (years): 58 Referring Provider: Azzie Glatter FNP Height (inches): 86 Interpreting Physician: Baird Lyons MD, ABSM Weight (lbs): 278 RPSGT: Jacolyn Reedy BMI: 41 MRN: [id number] Neck Size: 19.00  CLINICAL INFORMATION Sleep Study Type: HST Indication for sleep study: OSA Epworth Sleepiness Score: 8  SLEEP STUDY TECHNIQUE A multi-channel overnight portable sleep study was performed. The channels recorded were: nasal airflow, thoracic respiratory movement, and oxygen saturation with a pulse oximetry. Snoring was also monitored.  MEDICATIONS Patient self administered medications include: none reported.  SLEEP ARCHITECTURE Patient was studied for 352.5 minutes. The sleep efficiency was 100.0 % and the patient was supine for 90.4%. The arousal index was 0.0 per hour.  RESPIRATORY PARAMETERS The overall AHI was 42.9 per hour, with a central apnea index of 0.0 per hour. The oxygen nadir was 80% during sleep.  CARDIAC DATA Mean heart rate during sleep was 74.2 bpm.  IMPRESSIONS - Severe obstructive sleep apnea occurred during this study (AHI = 42.9/h). - No significant central sleep apnea occurred during this study (CAI = 0.0/h). - Oxygen desaturation was noted during this study (Min O2 = 80%). Mean sat 94%. - Patient snored.  DIAGNOSIS - Obstructive Sleep Apnea (327.23 [G47.33 ICD-10])  RECOMMENDATIONS - Suggest CPAP titration sleep study or autopap. Other options would be based on clinical judgment. - Be careful with alcohol, sedatives and other CNS depressants that may worsen sleep apnea and disrupt normal sleep architecture. - Sleep hygiene should be reviewed to assess factors that may improve sleep quality. - Weight management and regular exercise should be initiated or continued.  [Electronically signed] 04/14/2019 09:03 AM  Baird Lyons MD, ABSM Diplomate, American  Board of Sleep Medicine   NPI: [id number]                         [place]  ELECTRONICALLY SIGNED ON:  04/14/2019, 9:00 AM [place] PH: [phone]   FX: [phone] [place]

## 2019-05-01 MED FILL — SYMBICORT 80-4.5 MCG INH: 80-4.5 | 30 days supply | Qty: 10 | Fill #6

## 2019-06-03 ENCOUNTER — Other Ambulatory Visit: Payer: Self-pay | Admitting: Family Medicine

## 2019-06-03 DIAGNOSIS — J4520 Mild intermittent asthma, uncomplicated: Secondary | ICD-10-CM

## 2019-06-04 MED FILL — SYMBICORT 80-4.5 MCG INH: 80-4.5 | 30 days supply | Qty: 10 | Fill #0

## 2019-06-08 ENCOUNTER — Telehealth: Payer: Self-pay | Admitting: Family Medicine

## 2019-06-08 DIAGNOSIS — J4520 Mild intermittent asthma, uncomplicated: Secondary | ICD-10-CM

## 2019-06-08 MED ORDER — BUDESONIDE-FORMOTEROL FUMARATE 80-4.5 MCG/ACT IN AERO: INHALATION_SPRAY | RESPIRATORY_TRACT | 6 refills | Status: DC

## 2019-06-08 NOTE — Telephone Encounter (Signed)
Refilled

## 2019-07-05 ENCOUNTER — Other Ambulatory Visit: Payer: Self-pay | Admitting: Family Medicine

## 2019-07-05 DIAGNOSIS — E785 Hyperlipidemia, unspecified: Secondary | ICD-10-CM

## 2019-07-07 ENCOUNTER — Other Ambulatory Visit: Payer: Self-pay | Admitting: Family Medicine

## 2019-07-07 DIAGNOSIS — E785 Hyperlipidemia, unspecified: Secondary | ICD-10-CM

## 2019-07-14 DIAGNOSIS — E559 Vitamin D deficiency, unspecified: Secondary | ICD-10-CM

## 2019-07-14 DIAGNOSIS — E782 Mixed hyperlipidemia: Secondary | ICD-10-CM

## 2019-07-14 HISTORY — DX: Mixed hyperlipidemia: E78.2

## 2019-07-14 HISTORY — DX: Vitamin D deficiency, unspecified: E55.9

## 2019-07-31 ENCOUNTER — Other Ambulatory Visit: Payer: Self-pay

## 2019-07-31 ENCOUNTER — Other Ambulatory Visit: Payer: Self-pay | Admitting: Family Medicine

## 2019-07-31 ENCOUNTER — Encounter: Payer: Self-pay | Admitting: Family Medicine

## 2019-07-31 ENCOUNTER — Ambulatory Visit (INDEPENDENT_AMBULATORY_CARE_PROVIDER_SITE_OTHER): Payer: BLUE CROSS/BLUE SHIELD | Admitting: Family Medicine

## 2019-07-31 VITALS — BP 133/80 | HR 97 | Temp 98.3°F | Ht 69.0 in | Wt 291.4 lb

## 2019-07-31 DIAGNOSIS — R0602 Shortness of breath: Secondary | ICD-10-CM

## 2019-07-31 DIAGNOSIS — R635 Abnormal weight gain: Secondary | ICD-10-CM | POA: Diagnosis not present

## 2019-07-31 DIAGNOSIS — M62838 Other muscle spasm: Secondary | ICD-10-CM

## 2019-07-31 DIAGNOSIS — R7309 Other abnormal glucose: Secondary | ICD-10-CM

## 2019-07-31 DIAGNOSIS — J3089 Other allergic rhinitis: Secondary | ICD-10-CM

## 2019-07-31 DIAGNOSIS — Z0000 Encounter for general adult medical examination without abnormal findings: Secondary | ICD-10-CM

## 2019-07-31 DIAGNOSIS — Z131 Encounter for screening for diabetes mellitus: Secondary | ICD-10-CM | POA: Diagnosis not present

## 2019-07-31 DIAGNOSIS — Z09 Encounter for follow-up examination after completed treatment for conditions other than malignant neoplasm: Secondary | ICD-10-CM

## 2019-07-31 DIAGNOSIS — R7303 Prediabetes: Secondary | ICD-10-CM | POA: Diagnosis not present

## 2019-07-31 DIAGNOSIS — Z6838 Body mass index (BMI) 38.0-38.9, adult: Secondary | ICD-10-CM

## 2019-07-31 DIAGNOSIS — E66812 Obesity, class 2: Secondary | ICD-10-CM

## 2019-07-31 DIAGNOSIS — K2100 Gastro-esophageal reflux disease with esophagitis, without bleeding: Secondary | ICD-10-CM

## 2019-07-31 LAB — POCT GLYCOSYLATED HEMOGLOBIN (HGB A1C): Hemoglobin A1C: 6.8 % — AB (ref 4.0–5.6)

## 2019-07-31 LAB — POCT URINALYSIS DIPSTICK
Bilirubin, UA: NEGATIVE
Blood, UA: NEGATIVE
Glucose, UA: POSITIVE — AB
Ketones, UA: NEGATIVE
Leukocytes, UA: NEGATIVE
Nitrite, UA: NEGATIVE
Protein, UA: NEGATIVE
Spec Grav, UA: 1.02 (ref 1.010–1.025)
Urobilinogen, UA: 0.2 E.U./dL
pH, UA: 6 (ref 5.0–8.0)

## 2019-07-31 MED ORDER — OMEPRAZOLE 40 MG PO CPDR: 40.0000 mg | DELAYED_RELEASE_CAPSULE | Freq: Two times a day (BID) | ORAL | 6 refills | Status: DC

## 2019-07-31 MED ORDER — CETIRIZINE HCL 10 MG PO TABS: 10.0000 mg | ORAL_TABLET | Freq: Every day | ORAL | 11 refills | Status: DC

## 2019-07-31 NOTE — Progress Notes (Signed)
Patient Cole Morales   Established Patient Office Visit  Subjective:  Patient ID: Cole Morales, male    DOB: [birth year]  Age: 43 y.o. MRN: [id number]  CC:  Chief Complaint  Patient presents with  . Follow-up    6 mth follow up - medication     HPI Cole Morales is a 43 year old male who presents for Follow Up today.   Past Medical History:  Diagnosis Date  . Asthma   . GERD (gastroesophageal reflux disease)     Current Status: Since his last office visit, he continues to have increased acid indigestion. He denies fevers, chills, fatigue, recent infections, weight loss, and night sweats. He has not had any headaches, visual changes, dizziness, and falls. He reports occasional shortness of breath. No chest pain, heart palpitations, and cough reported. No reports of GI problems such as nausea, vomiting, diarrhea, and constipation. He has no reports of blood in stools, dysuria and hematuria. No depression or anxiety reported today. He denies suicidal ideations, homicidal ideations, or auditory hallucinations. He denies pain today.    Past Surgical History:  Procedure Laterality Date  . MOUTH SURGERY      Family History  Problem Relation Age of Onset  . Asthma Mother   . Cancer Maternal Grandmother   . Cancer Maternal Grandfather     Social History   Socioeconomic History  . Marital status: Single    Spouse name: Not on file  . Number of children: Not on file  . Years of education: Not on file  . Highest education level: Not on file  Occupational History  . Not on file  Tobacco Use  . Smoking status: Former Smoker    Types: Cigarettes    Quit date: 01/19/2018    Years since quitting: 1.5  . Smokeless tobacco: Never Used  Substance and Sexual Activity  . Alcohol use: Yes  . Drug use: Yes    Types: Marijuana  . Sexual activity: Not Currently  Other Topics Concern  . Not on file  Social History Narrative  . Not on file     Social Determinants of Health   Financial Resource Strain:   . Difficulty of Paying Living Expenses: Not on file  Food Insecurity:   . Worried About Charity fundraiser in the Last Year: Not on file  . Ran Out of Food in the Last Year: Not on file  Transportation Needs:   . Lack of Transportation (Medical): Not on file  . Lack of Transportation (Non-Medical): Not on file  Physical Activity:   . Days of Exercise per Week: Not on file  . Minutes of Exercise per Session: Not on file  Stress:   . Feeling of Stress : Not on file  Social Connections:   . Frequency of Communication with Friends and Family: Not on file  . Frequency of Social Gatherings with Friends and Family: Not on file  . Attends Religious Services: Not on file  . Active Member of Clubs or Organizations: Not on file  . Attends Archivist Meetings: Not on file  . Marital Status: Not on file  Intimate Partner Violence:   . Fear of Current or Ex-Partner: Not on file  . Emotionally Abused: Not on file  . Physically Abused: Not on file  . Sexually Abused: Not on file    Outpatient Medications Prior to Visit  Medication Sig Dispense Refill  . albuterol (VENTOLIN HFA) 108 (90  Base) MCG/ACT inhaler Inhale 1-2 puffs into the lungs every 6 (six) hours as needed for wheezing or shortness of breath. 1 Inhaler 0  . budesonide-formoterol (SYMBICORT) 80-4.5 MCG/ACT inhaler INHALE 2 PUFFS INTO THE LUNGS 2 (TWO) TIMES DAILY. 10.2 g 6  . fluticasone (FLONASE) 50 MCG/ACT nasal spray Place 2 sprays into both nostrils daily. 16 g 6  . cetirizine (ZYRTEC) 10 MG tablet Take 1 tablet (10 mg total) by mouth daily. 30 tablet 11  . atorvastatin (LIPITOR) 20 MG tablet TAKE 1 TABLET(20 MG) BY MOUTH DAILY 90 tablet 0  . cyclobenzaprine (FLEXERIL) 10 MG tablet Take 1 tablet (10 mg total) by mouth 3 (three) times daily as needed for muscle spasms. 30 tablet 3  . guaiFENesin (MUCINEX) 600 MG 12 hr tablet Take 1 tablet (600 mg total) by  mouth 2 (two) times daily. (Patient not taking: Reported on 10/28/2018) 30 tablet 1  . ibuprofen (ADVIL) 800 MG tablet Take 1 tablet (800 mg total) by mouth every 8 (eight) hours as needed. 30 tablet 3  . ketoconazole (NIZORAL) 2 % shampoo Apply 1 application topically 2 (two) times a week. (Patient not taking: Reported on 10/28/2018) 120 mL 0  . omeprazole (PRILOSEC) 20 MG capsule Take 1 capsule (20 mg total) by mouth 2 (two) times daily before a meal. 30 capsule 0   No facility-administered medications prior to visit.    No Known Allergies  ROS Review of Systems  Constitutional: Negative.   HENT: Negative.   Eyes: Negative.   Respiratory: Negative.   Cardiovascular: Negative.   Gastrointestinal: Negative.   Endocrine: Negative.   Genitourinary: Negative.   Musculoskeletal: Negative.   Skin: Negative.   Allergic/Immunologic: Negative.   Neurological: Negative.   Hematological: Negative.   Psychiatric/Behavioral: Negative.       Objective:    Physical Exam  Constitutional: He is oriented to person, place, and time. He appears well-developed and well-nourished.  HENT:  Head: Normocephalic and atraumatic.  Eyes: Conjunctivae are normal.  Cardiovascular: Normal rate, regular rhythm, normal heart sounds and intact distal pulses.  Pulmonary/Chest: Breath sounds normal.  Abdominal: Soft. Bowel sounds are normal. He exhibits distension (obese).  Musculoskeletal:        General: Normal range of motion.     Cervical back: Normal range of motion and neck supple.  Neurological: He is alert and oriented to person, place, and time. He has normal reflexes.  Skin: Skin is warm and dry.  Psychiatric: He has a normal mood and affect. His behavior is normal. Judgment and thought content normal.  Nursing note and vitals reviewed.   BP 133/80   Pulse 97   Temp 98.3 F (36.8 C) (Oral)   Ht '5\' 9"'$  (1.753 m)   Wt 291 lb 6.4 oz (132.2 kg)   SpO2 95%   BMI 43.03 kg/m  Wt Readings from  Last 3 Encounters:  07/31/19 291 lb 6.4 oz (132.2 kg)  04/06/19 278 lb (126.1 kg)  01/28/19 283 lb 4.8 oz (128.5 kg)     Health Maintenance Due  Topic Date Due  . HIV Screening  03/05/1990  . TETANUS/TDAP  03/05/1994  . INFLUENZA VACCINE  03/14/2019    There are no preventive Morales reminders to display for this patient.  Lab Results  Component Value Date   TSH 2.430 07/23/2018   Lab Results  Component Value Date   WBC 6.5 08/16/2018   HGB 16.2 08/16/2018   HCT 48.0 08/16/2018   MCV 83.5 08/16/2018  PLT 215 08/16/2018   Lab Results  Component Value Date   NA 140 08/16/2018   K 3.9 08/16/2018   CO2 26 08/16/2018   GLUCOSE 111 (H) 08/16/2018   BUN 7 08/16/2018   CREATININE 0.92 08/16/2018   BILITOT 0.3 07/23/2018   ALKPHOS 88 07/23/2018   AST 32 07/23/2018   ALT 54 (H) 07/23/2018   PROT 7.3 07/23/2018   ALBUMIN 4.6 07/23/2018   CALCIUM 9.5 08/16/2018   ANIONGAP 9 08/16/2018   Lab Results  Component Value Date   CHOL 215 (H) 07/23/2018   Lab Results  Component Value Date   HDL 31 (L) 07/23/2018   Lab Results  Component Value Date   LDLCALC 143 (H) 07/23/2018   Lab Results  Component Value Date   TRIG 207 (H) 07/23/2018   Lab Results  Component Value Date   CHOLHDL 6.9 (H) 07/23/2018   Lab Results  Component Value Date   HGBA1C 6.8 (A) 07/31/2019    Assessment & Plan:   1. Class 2 severe obesity due to excess calories with serious comorbidity and body mass index (BMI) of 38.0 to 38.9 in adult [facility]) Body mass index is 43.03 kg/m. Goal BMI  is <30. Encouraged efforts to reduce weight include engaging in physical activity as tolerated with goal of 150 minutes per week. Improve dietary choices and eat a meal regimen consistent with a Mediterranean or DASH diet. Reduce simple carbohydrates. Do not skip meals and eat healthy snacks throughout the day to avoid over-eating at dinner. Set a goal weight loss that is achievable for you.  2. Weight gain He  has close to a 10 lb weight gain in 6 months.   3. Prediabetes  4. Hemoglobin A1c less than 7.0% Hgb A1c remains stable at 6.8 today. Monitor.   5. Muscle spasm  6. Environmental and seasonal allergies - cetirizine (ZYRTEC) 10 MG tablet; Take 1 tablet (10 mg total) by mouth daily. (Patient not taking: Reported on 07/31/2019)  Dispense: 30 tablet; Refill: 11  7. Shortness of breath Stable. No signs or symptoms of respiratory distress noted or reported.   8. Acid reflux Prilosec dose increased to 40 mg BID.   9. Screening for diabetes mellitus - POCT HgB A1C - Urinalysis Dipstick  10. Healthcare maintenance - CBC with Differential - Comp Met (CMET); Future - Lipid Panel - TSH - Vitamin B12 - Vitamin D, 25-hydroxy  11. Follow up He will follow up in 6 months.   Meds ordered this encounter  Medications  . cetirizine (ZYRTEC) 10 MG tablet    Sig: Take 1 tablet (10 mg total) by mouth daily.    Dispense:  30 tablet    Refill:  11  . omeprazole (PRILOSEC) 40 MG capsule    Sig: Take 1 capsule (40 mg total) by mouth 2 (two) times daily.    Dispense:  60 capsule    Refill:  6    Orders Placed This Encounter  Procedures  . CBC with Differential  . Comp Met (CMET)  . Lipid Panel  . TSH  . Vitamin B12  . Vitamin D, 25-hydroxy  . POCT HgB A1C  . Urinalysis Dipstick    Referral Orders  No referral(s) requested today    Cole Becton,  MSN, FNP-BC [facility] [address] [place], [place] [address] [phone]   Problem List Items Addressed This Visit      Other  Muscle spasm    Other Visit Diagnoses    Class 2 severe obesity due to excess calories with serious comorbidity and body mass index (BMI) of 38.0 to 38.9 in adult (HCC)    -  Primary   Weight gain       Prediabetes       Hemoglobin A1c less than 7.0%       Environmental and seasonal allergies        Relevant Medications   cetirizine (ZYRTEC) 10 MG tablet   Shortness of breath       Screening for diabetes mellitus       Relevant Orders   POCT HgB A1C (Completed)   Urinalysis Dipstick (Completed)   Healthcare maintenance       Relevant Orders   CBC with Differential   Comp Met (CMET)   Lipid Panel   TSH   Vitamin B12   Vitamin D, 25-hydroxy   Follow up          Meds ordered this encounter  Medications  . cetirizine (ZYRTEC) 10 MG tablet    Sig: Take 1 tablet (10 mg total) by mouth daily.    Dispense:  30 tablet    Refill:  11  . omeprazole (PRILOSEC) 40 MG capsule    Sig: Take 1 capsule (40 mg total) by mouth 2 (two) times daily.    Dispense:  60 capsule    Refill:  6    Follow-up: Return in about 6 months (around 01/29/2020).    Cole Glatter, FNP

## 2019-08-01 LAB — CBC WITH DIFFERENTIAL/PLATELET
Basophils Absolute: 0.1 10*3/uL (ref 0.0–0.2)
Basos: 1 %
EOS (ABSOLUTE): 0.1 10*3/uL (ref 0.0–0.4)
Eos: 1 %
Hematocrit: 45.2 % (ref 37.5–51.0)
Hemoglobin: 15.3 g/dL (ref 13.0–17.7)
Immature Grans (Abs): 0 10*3/uL (ref 0.0–0.1)
Immature Granulocytes: 0 %
Lymphocytes Absolute: 1.7 10*3/uL (ref 0.7–3.1)
Lymphs: 23 %
MCH: 27.8 pg (ref 26.6–33.0)
MCHC: 33.8 g/dL (ref 31.5–35.7)
MCV: 82 fL (ref 79–97)
Monocytes Absolute: 0.5 10*3/uL (ref 0.1–0.9)
Monocytes: 7 %
Neutrophils Absolute: 4.9 10*3/uL (ref 1.4–7.0)
Neutrophils: 68 %
Platelets: 226 10*3/uL (ref 150–450)
RBC: 5.5 x10E6/uL (ref 4.14–5.80)
RDW: 12.5 % (ref 11.6–15.4)
WBC: 7.3 10*3/uL (ref 3.4–10.8)

## 2019-08-01 LAB — LIPID PANEL
Chol/HDL Ratio: 5.6 ratio — ABNORMAL HIGH (ref 0.0–5.0)
Cholesterol, Total: 139 mg/dL (ref 100–199)
HDL: 25 mg/dL — ABNORMAL LOW (ref >39)
LDL Chol Calc (NIH): 66 mg/dL (ref 0–99)
Triglycerides: 298 mg/dL — ABNORMAL HIGH (ref 0–149)
VLDL Cholesterol Cal: 48 mg/dL — ABNORMAL HIGH (ref 5–40)

## 2019-08-01 LAB — VITAMIN B12: Vitamin B-12: 451 pg/mL (ref 232–1245)

## 2019-08-01 LAB — VITAMIN D 25 HYDROXY (VIT D DEFICIENCY, FRACTURES): Vit D, 25-Hydroxy: 14 ng/mL — ABNORMAL LOW (ref 30.0–100.0)

## 2019-08-01 LAB — TSH: TSH: 1.79 u[IU]/mL (ref 0.450–4.500)

## 2019-08-02 ENCOUNTER — Encounter: Payer: Self-pay | Admitting: Family Medicine

## 2019-08-09 ENCOUNTER — Encounter: Payer: Self-pay | Admitting: Family Medicine

## 2019-08-09 ENCOUNTER — Other Ambulatory Visit: Payer: Self-pay | Admitting: Family Medicine

## 2019-08-09 DIAGNOSIS — E559 Vitamin D deficiency, unspecified: Secondary | ICD-10-CM

## 2019-08-09 DIAGNOSIS — E782 Mixed hyperlipidemia: Secondary | ICD-10-CM

## 2019-08-09 DIAGNOSIS — E66812 Obesity, class 2: Secondary | ICD-10-CM

## 2019-08-09 DIAGNOSIS — E785 Hyperlipidemia, unspecified: Secondary | ICD-10-CM

## 2019-08-09 MED ORDER — ATORVASTATIN CALCIUM 20 MG PO TABS: ORAL_TABLET | ORAL | 1 refills | Status: DC

## 2019-08-09 MED ORDER — VITAMIN D (ERGOCALCIFEROL) 1.25 MG (50000 UNIT) PO CAPS: 50000.0000 [IU] | ORAL_CAPSULE | ORAL | 6 refills | Status: DC

## 2019-10-04 ENCOUNTER — Other Ambulatory Visit: Payer: Self-pay | Admitting: Family Medicine

## 2019-10-04 DIAGNOSIS — Z6838 Body mass index (BMI) 38.0-38.9, adult: Secondary | ICD-10-CM

## 2019-10-04 DIAGNOSIS — E782 Mixed hyperlipidemia: Secondary | ICD-10-CM

## 2019-10-04 DIAGNOSIS — E785 Hyperlipidemia, unspecified: Secondary | ICD-10-CM

## 2020-01-12 DIAGNOSIS — F4321 Adjustment disorder with depressed mood: Secondary | ICD-10-CM

## 2020-01-12 DIAGNOSIS — F419 Anxiety disorder, unspecified: Secondary | ICD-10-CM

## 2020-01-12 HISTORY — DX: Adjustment disorder with depressed mood: F43.21

## 2020-01-12 HISTORY — DX: Anxiety disorder, unspecified: F41.9

## 2020-01-29 ENCOUNTER — Ambulatory Visit (INDEPENDENT_AMBULATORY_CARE_PROVIDER_SITE_OTHER): Payer: BLUE CROSS/BLUE SHIELD | Admitting: Family Medicine

## 2020-01-29 ENCOUNTER — Encounter: Payer: Self-pay | Admitting: Family Medicine

## 2020-01-29 ENCOUNTER — Other Ambulatory Visit: Payer: Self-pay

## 2020-01-29 VITALS — BP 139/87 | HR 90 | Temp 98.9°F | Ht 69.0 in | Wt 296.0 lb

## 2020-01-29 DIAGNOSIS — E1165 Type 2 diabetes mellitus with hyperglycemia: Secondary | ICD-10-CM | POA: Diagnosis not present

## 2020-01-29 DIAGNOSIS — J45909 Unspecified asthma, uncomplicated: Secondary | ICD-10-CM

## 2020-01-29 DIAGNOSIS — R7303 Prediabetes: Secondary | ICD-10-CM | POA: Diagnosis not present

## 2020-01-29 DIAGNOSIS — F419 Anxiety disorder, unspecified: Secondary | ICD-10-CM | POA: Diagnosis not present

## 2020-01-29 DIAGNOSIS — E66812 Obesity, class 2: Secondary | ICD-10-CM

## 2020-01-29 DIAGNOSIS — F4321 Adjustment disorder with depressed mood: Secondary | ICD-10-CM

## 2020-01-29 DIAGNOSIS — E119 Type 2 diabetes mellitus without complications: Secondary | ICD-10-CM

## 2020-01-29 DIAGNOSIS — R0602 Shortness of breath: Secondary | ICD-10-CM

## 2020-01-29 DIAGNOSIS — Z6838 Body mass index (BMI) 38.0-38.9, adult: Secondary | ICD-10-CM

## 2020-01-29 DIAGNOSIS — Z09 Encounter for follow-up examination after completed treatment for conditions other than malignant neoplasm: Secondary | ICD-10-CM

## 2020-01-29 LAB — POCT GLYCOSYLATED HEMOGLOBIN (HGB A1C)
HbA1c POC (<> result, manual entry): 8.5 % (ref 4.0–5.6)
HbA1c, POC (controlled diabetic range): 8.5 % — AB (ref 0.0–7.0)
HbA1c, POC (prediabetic range): 8.5 % — AB (ref 5.7–6.4)
Hemoglobin A1C: 8.5 % — AB (ref 4.0–5.6)

## 2020-01-29 LAB — POCT URINALYSIS DIPSTICK
Bilirubin, UA: NEGATIVE
Blood, UA: NEGATIVE
Glucose, UA: NEGATIVE
Ketones, UA: NEGATIVE
Leukocytes, UA: NEGATIVE
Nitrite, UA: NEGATIVE
Protein, UA: NEGATIVE
Spec Grav, UA: 1.025 (ref 1.010–1.025)
Urobilinogen, UA: 0.2 E.U./dL
pH, UA: 5.5 (ref 5.0–8.0)

## 2020-01-29 LAB — GLUCOSE, POCT (MANUAL RESULT ENTRY): POC Glucose: 138 mg/dl — AB (ref 70–99)

## 2020-01-29 MED ORDER — ALBUTEROL SULFATE (2.5 MG/3ML) 0.083% IN NEBU: 2.5000 mg | INHALATION_SOLUTION | Freq: Four times a day (QID) | RESPIRATORY_TRACT | 12 refills | Status: DC | PRN

## 2020-01-29 MED ORDER — ALPRAZOLAM 0.5 MG PO TABS: 0.5000 mg | ORAL_TABLET | Freq: Every evening | ORAL | 0 refills | Status: AC | PRN

## 2020-01-29 NOTE — Patient Instructions (Signed)
Generalized Anxiety Disorder, Adult Generalized anxiety disorder (GAD) is a mental health disorder. People with this condition constantly worry about everyday events. Unlike normal anxiety, worry related to GAD is not triggered by a specific event. These worries also do not fade or get better with time. GAD interferes with life functions, including relationships, work, and school. GAD can vary from mild to severe. People with severe GAD can have intense waves of anxiety with physical symptoms (panic attacks). What are the causes? The exact cause of GAD is not known. What increases the risk? This condition is more likely to develop in:  Women.  People who have a family history of anxiety disorders.  People who are very shy.  People who experience very stressful life events, such as the death of a loved one.  People who have a very stressful family environment. What are the signs or symptoms? People with GAD often worry excessively about many things in their lives, such as their health and family. They may also be overly concerned about:  Doing well at work.  Being on time.  Natural disasters.  Friendships. Physical symptoms of GAD include:  Fatigue.  Muscle tension or having muscle twitches.  Trembling or feeling shaky.  Being easily startled.  Feeling like your heart is pounding or racing.  Feeling out of breath or like you cannot take a deep breath.  Having trouble falling asleep or staying asleep.  Sweating.  Nausea, diarrhea, or irritable bowel syndrome (IBS).  Headaches.  Trouble concentrating or remembering facts.  Restlessness.  Irritability. How is this diagnosed? Your health care provider can diagnose GAD based on your symptoms and medical history. You will also have a physical exam. The health care provider will ask specific questions about your symptoms, including how severe they are, when they started, and if they come and go. Your health care  provider may ask you about your use of alcohol or drugs, including prescription medicines. Your health care provider may refer you to a mental health specialist for further evaluation. Your health care provider will do a thorough examination and may perform additional tests to rule out other possible causes of your symptoms. To be diagnosed with GAD, a person must have anxiety that:  Is out of his or her control.  Affects several different aspects of his or her life, such as work and relationships.  Causes distress that makes him or her unable to take part in normal activities.  Includes at least three physical symptoms of GAD, such as restlessness, fatigue, trouble concentrating, irritability, muscle tension, or sleep problems. Before your health care provider can confirm a diagnosis of GAD, these symptoms must be present more days than they are not, and they must last for six months or longer. How is this treated? The following therapies are usually used to treat GAD:  Medicine. Antidepressant medicine is usually prescribed for long-term daily control. Antianxiety medicines may be added in severe cases, especially when panic attacks occur.  Talk therapy (psychotherapy). Certain types of talk therapy can be helpful in treating GAD by providing support, education, and guidance. Options include: ? Cognitive behavioral therapy (CBT). People learn coping skills and techniques to ease their anxiety. They learn to identify unrealistic or negative thoughts and behaviors and to replace them with positive ones. ? Acceptance and commitment therapy (ACT). This treatment teaches people how to be mindful as a way to cope with unwanted thoughts and feelings. ? Biofeedback. This process trains you to manage your body's response (  physiological response) through breathing techniques and relaxation methods. You will work with a therapist while machines are used to monitor your physical symptoms.  Stress  management techniques. These include yoga, meditation, and exercise. A mental health specialist can help determine which treatment is best for you. Some people see improvement with one type of therapy. However, other people require a combination of therapies. Follow these instructions at home:  Take over-the-counter and prescription medicines only as told by your health care provider.  Try to maintain a normal routine.  Try to anticipate stressful situations and allow extra time to manage them.  Practice any stress management or self-calming techniques as taught by your health care provider.  Do not punish yourself for setbacks or for not making progress.  Try to recognize your accomplishments, even if they are small.  Keep all follow-up visits as told by your health care provider. This is important. Contact a health care provider if:  Your symptoms do not get better.  Your symptoms get worse.  You have signs of depression, such as: ? A persistently sad, cranky, or irritable mood. ? Loss of enjoyment in activities that used to bring you joy. ? Change in weight or eating. ? Changes in sleeping habits. ? Avoiding friends or family members. ? Loss of energy for normal tasks. ? Feelings of guilt or worthlessness. Get help right away if:  You have serious thoughts about hurting yourself or others. If you ever feel like you may hurt yourself or others, or have thoughts about taking your own life, get help right away. You can go to your nearest emergency department or call:  Your local emergency services (911 in the [place]  A suicide crisis helpline, such as the National Suicide Prevention Lifeline at 1-800-273-8255. This is open 24 hours a day. Summary  Generalized anxiety disorder (GAD) is a mental health disorder that involves worry that is not triggered by a specific event.  People with GAD often worry excessively about many things in their lives, such as their health and  family.  GAD may cause physical symptoms such as restlessness, trouble concentrating, sleep problems, frequent sweating, nausea, diarrhea, headaches, and trembling or muscle twitching.  A mental health specialist can help determine which treatment is best for you. Some people see improvement with one type of therapy. However, other people require a combination of therapies. This information is not intended to replace advice given to you by your health care provider. Make sure you discuss any questions you have with your health care provider. Document Revised: 07/12/2017 Document Reviewed: 06/19/2016 Elsevier Patient Education  2020 Else[employer] Alprazolam tablets What is this medicine? ALPRAZOLAM (al PRAY zoe lam) is a benzodiazepine. It is used to treat anxiety and panic attacks. This medicine may be used for other purposes; ask your health care provider or pharmacist if you have questions. COMMON BRAND NAME(S): Xanax What should I tell my health care provider before I take this medicine? They need to know if you have any of these conditions:  an alcohol or drug abuse problem  bipolar disorder, depression, psychosis or other mental health conditions  glaucoma  kidney or liver disease  lung or breathing disease  myasthenia gravis  Parkinson's disease  porphyria  seizures or a history of seizures  suicidal thoughts  an unusual or allergic reaction to alprazolam, other benzodiazepines, foods, dyes, or preservatives  pregnant or trying to get pregnant  breast-feeding How should I use this medicine? Take this medicine by mouth with   a glass of water. Follow the directions on the prescription label. Take your medicine at regular intervals. Do not take it more often than directed. Do not stop taking except on your doctor's advice. A special MedGuide will be given to you by the pharmacist with each prescription and refill. Be sure to read this information carefully each  time. Talk to your pediatrician regarding the use of this medicine in children. Special care may be needed. Overdosage: If you think you have taken too much of this medicine contact a poison control center or emergency room at once. NOTE: This medicine is only for you. Do not share this medicine with others. What if I miss a dose? If you miss a dose, take it as soon as you can. If it is almost time for your next dose, take only that dose. Do not take double or extra doses. What may interact with this medicine? Do not take this medicine with any of the following medications:  certain antiviral medicines for HIV or AIDS like delavirdine, indinavir  certain medicines for fungal infections like ketoconazole and itraconazole  narcotic medicines for cough  sodium oxybate This medicine may also interact with the following medications:  alcohol  antihistamines for allergy, cough and cold  certain antibiotics like clarithromycin, erythromycin, isoniazid, rifampin, rifapentine, rifabutin, and troleandomycin  certain medicines for blood pressure, heart disease, irregular heart beat  certain medicines for depression, like amitriptyline, fluoxetine, sertraline  certain medicines for seizures like carbamazepine, oxcarbazepine, phenobarbital, phenytoin, primidone  cimetidine  cyclosporine  male hormones, like estrogens or progestins and birth control pills, patches, rings, or injections  general anesthetics like halothane, isoflurane, methoxyflurane, propofol  grapefruit juice  local anesthetics like lidocaine, pramoxine, tetracaine  medicines that relax muscles for surgery  narcotic medicines for pain  other antiviral medicines for HIV or AIDS  phenothiazines like chlorpromazine, mesoridazine, prochlorperazine, thioridazine This list may not describe all possible interactions. Give your health care provider a list of all the medicines, herbs, non-prescription drugs, or dietary  supplements you use. Also tell them if you smoke, drink alcohol, or use illegal drugs. Some items may interact with your medicine. What should I watch for while using this medicine? Tell your doctor or health care professional if your symptoms do not start to get better or if they get worse. Do not stop taking except on your doctor's advice. You may develop a severe reaction. Your doctor will tell you how much medicine to take. You may get drowsy or dizzy. Do not drive, use machinery, or do anything that needs mental alertness until you know how this medicine affects you. To reduce the risk of dizzy and fainting spells, do not stand or sit up quickly, especially if you are an older patient. Alcohol may increase dizziness and drowsiness. Avoid alcoholic drinks. If you are taking another medicine that also causes drowsiness, you may have more side effects. Give your health care provider a list of all medicines you use. Your doctor will tell you how much medicine to take. Do not take more medicine than directed. Call emergency for help if you have problems breathing or unusual sleepiness. What side effects may I notice from receiving this medicine? Side effects that you should report to your doctor or health care professional as soon as possible:  allergic reactions like skin rash, itching or hives, swelling of the face, lips, or tongue  breathing problems  confusion  loss of balance or coordination  signs and symptoms of low blood   pressure like dizziness; feeling faint or lightheaded, falls; unusually weak or tired  suicidal thoughts or other mood changes Side effects that usually do not require medical attention (report to your doctor or health care professional if they continue or are bothersome):  dizziness  dry mouth  nausea, vomiting  tiredness This list may not describe all possible side effects. Call your doctor for medical advice about side effects. You may report side effects to  FDA at 1-800-FDA-1088. Where should I keep my medicine? Keep out of the reach of children. This medicine can be abused. Keep your medicine in a safe place to protect it from theft. Do not share this medicine with anyone. Selling or giving away this medicine is dangerous and against the law. Store at room temperature between 20 and 25 degrees C (68 and 77 degrees F). This medicine may cause accidental overdose and death if taken by other adults, children, or pets. Mix any unused medicine with a substance like cat litter or coffee grounds. Then throw the medicine away in a sealed container like a sealed bag or a coffee can with a lid. Do not use the medicine after the expiration date. NOTE: This sheet is a summary. It may not cover all possible information. If you have questions about this medicine, talk to your doctor, pharmacist, or health care provider.  2020 Elsevier/Gold Standard (2015-04-28 13:47:25)  

## 2020-01-29 NOTE — Progress Notes (Signed)
[facility]    Established Patient Office Visit  Subjective:  Patient ID: Cole Morales, male    DOB: 10-12-1975  Age: 44 y.o. MRN: [id number]  CC:  Chief Complaint  Patient presents with  . Follow-up    6 month follow up; disability parking placard form;  . Depression    feels sad/upset in the evenings    HPI Cole Morales is a 44 year old male who presents for Follow Up today.     Patient Active Problem List   Diagnosis Date Noted  . Muscle spasm 01/29/2019  . Sleep apnea 01/29/2019  . Chest discomfort 10/28/2018  . Left shoulder pain 10/28/2018  . Gastroesophageal reflux disease with esophagitis 10/28/2018    Past Medical History:  Diagnosis Date  . Asthma   . GERD (gastroesophageal reflux disease)   . Hyperlipidemia, mixed 07/2019  . Obesity   . Vitamin D deficiency 07/2019    Current Status: Since his last office visit, he is doing well with no complaints. He continues to have shortness of breath intermittently. No chest pain, heart palpitations, and cough reported. He denies fatigue, frequent urination, blurred vision, excessive hunger, excessive thirst, weight gain, weight loss, and poor wound healing. He continues to check his feet regularly. He denies fevers, chills, fatigue, recent infections, weight loss, and night sweats. He has not had any headaches, visual changes, dizziness, and falls.  Denies GI problems such as nausea, vomiting, diarrhea, and constipation. He has no reports of blood in stools, dysuria and hematuria. No depression or anxiety, and denies suicidal ideations, homicidal ideations, or auditory hallucinations. He is taking all medications as prescribed. He denies pain today.   Past Surgical History:  Procedure Laterality Date  . MOUTH SURGERY      Family History  Problem Relation Age of Onset  . Asthma Mother   . Cancer Maternal Grandmother   . Cancer Maternal Grandfather     Social  History   Socioeconomic History  . Marital status: Single    Spouse name: Not on file  . Number of children: Not on file  . Years of education: Not on file  . Highest education level: Not on file  Occupational History  . Not on file  Tobacco Use  . Smoking status: Former Smoker    Types: Cigarettes    Quit date: 01/19/2018    Years since quitting: 2.0  . Smokeless tobacco: Never Used  Vaping Use  . Vaping Use: Never used  Substance and Sexual Activity  . Alcohol use: Yes  . Drug use: Yes    Types: Marijuana  . Sexual activity: Not Currently  Other Topics Concern  . Not on file  Social History Narrative  . Not on file   Social Determinants of Health   Financial Resource Strain:   . Difficulty of Paying Living Expenses:   Food Insecurity:   . Worried About Programme researcher, broadcasting/film/video in the Last Year:   . Barista in the Last Year:   Transportation Needs:   . Freight forwarder (Medical):   Marland Kitchen Lack of Transportation (Non-Medical):   Physical Activity:   . Days of Exercise per Week:   . Minutes of Exercise per Session:   Stress:   . Feeling of Stress :   Social Connections:   . Frequency of Communication with Friends and Family:   . Frequency of Social Gatherings with Friends and Family:   . Attends  Religious Services:   . Active Member of Clubs or Organizations:   . Attends Banker Meetings:   Marland Kitchen Marital Status:   Intimate Partner Violence:   . Fear of Current or Ex-Partner:   . Emotionally Abused:   Marland Kitchen Physically Abused:   . Sexually Abused:     Outpatient Medications Prior to Visit  Medication Sig Dispense Refill  . albuterol (VENTOLIN HFA) 108 (90 Base) MCG/ACT inhaler Inhale 1-2 puffs into the lungs every 6 (six) hours as needed for wheezing or shortness of breath. 1 Inhaler 0  . atorvastatin (LIPITOR) 20 MG tablet TAKE 1 TABLET(20 MG) BY MOUTH DAILY 90 tablet 1  . budesonide-formoterol (SYMBICORT) 80-4.5 MCG/ACT inhaler INHALE 2 PUFFS INTO  THE LUNGS 2 (TWO) TIMES DAILY. 10.2 g 6  . cetirizine (ZYRTEC) 10 MG tablet Take 1 tablet (10 mg total) by mouth daily. 30 tablet 11  . fluticasone (FLONASE) 50 MCG/ACT nasal spray Place 2 sprays into both nostrils daily. 16 g 6  . omeprazole (PRILOSEC) 40 MG capsule Take 1 capsule (40 mg total) by mouth 2 (two) times daily. 60 capsule 6  . Vitamin D, Ergocalciferol, (DRISDOL) 1.25 MG (50000 UT) CAPS capsule Take 1 capsule (50,000 Units total) by mouth every 7 (seven) days. 5 capsule 6  . cyclobenzaprine (FLEXERIL) 10 MG tablet Take 1 tablet (10 mg total) by mouth 3 (three) times daily as needed for muscle spasms. (Patient not taking: Reported on 01/29/2020) 30 tablet 3  . guaiFENesin (MUCINEX) 600 MG 12 hr tablet Take 1 tablet (600 mg total) by mouth 2 (two) times daily. (Patient not taking: Reported on 10/28/2018) 30 tablet 1  . ibuprofen (ADVIL) 800 MG tablet Take 1 tablet (800 mg total) by mouth every 8 (eight) hours as needed. (Patient not taking: Reported on 01/29/2020) 30 tablet 3   No facility-administered medications prior to visit.    No Known Allergies  ROS Review of Systems  Constitutional: Negative.   HENT: Negative.   Eyes: Negative.   Respiratory: Negative.   Cardiovascular: Negative.   Gastrointestinal: Negative.   Endocrine: Negative.   Genitourinary: Negative.   Musculoskeletal: Negative.   Skin: Negative.   Allergic/Immunologic: Negative.   Neurological: Positive for dizziness (occasional ) and headaches (occasional ).  Hematological: Negative.   Psychiatric/Behavioral: Negative.       Objective:    Physical Exam Vitals and nursing note reviewed.  Constitutional:      Appearance: Normal appearance.  HENT:     Head: Normocephalic and atraumatic.     Nose: Nose normal.     Mouth/Throat:     Mouth: Mucous membranes are moist.  Cardiovascular:     Rate and Rhythm: Normal rate and regular rhythm.     Pulses: Normal pulses.     Heart sounds: Normal heart  sounds.  Pulmonary:     Effort: Pulmonary effort is normal.     Breath sounds: Normal breath sounds.  Abdominal:     General: Bowel sounds are normal.     Palpations: Abdomen is soft.  Musculoskeletal:        General: Normal range of motion.     Cervical back: Normal range of motion and neck supple.  Skin:    General: Skin is warm and dry.  Neurological:     General: No focal deficit present.     Mental Status: He is alert and oriented to person, place, and time.     BP 139/87 (BP Location: Left Arm, Patient Position:  Sitting, Cuff Size: Large)   Pulse 90   Temp 98.9 F (37.2 C)   Ht 5\' 9"  (1.753 m)   Wt 296 lb 0.2 oz (134.3 kg)   SpO2 99%   BMI 43.71 kg/m  Wt Readings from Last 3 Encounters:  01/29/20 296 lb 0.2 oz (134.3 kg)  07/31/19 291 lb 6.4 oz (132.2 kg)  04/06/19 278 lb (126.1 kg)     Health Maintenance Due  Topic Date Due  . Hepatitis C Screening  Never done  . COVID-19 Vaccine (1) Never done  . HIV Screening  Never done  . TETANUS/TDAP  Never done    There are no preventive care reminders to display for this patient.  Lab Results  Component Value Date   TSH 1.790 07/31/2019   Lab Results  Component Value Date   WBC 7.3 07/31/2019   HGB 15.3 07/31/2019   HCT 45.2 07/31/2019   MCV 82 07/31/2019   PLT 226 07/31/2019   Lab Results  Component Value Date   NA 140 08/16/2018   K 3.9 08/16/2018   CO2 26 08/16/2018   GLUCOSE 111 (H) 08/16/2018   BUN 7 08/16/2018   CREATININE 0.92 08/16/2018   BILITOT 0.3 07/23/2018   ALKPHOS 88 07/23/2018   AST 32 07/23/2018   ALT 54 (H) 07/23/2018   PROT 7.3 07/23/2018   ALBUMIN 4.6 07/23/2018   CALCIUM 9.5 08/16/2018   ANIONGAP 9 08/16/2018   Lab Results  Component Value Date   CHOL 139 07/31/2019   Lab Results  Component Value Date   HDL 25 (L) 07/31/2019   Lab Results  Component Value Date   LDLCALC 66 07/31/2019   Lab Results  Component Value Date   TRIG 298 (H) 07/31/2019   Lab Results    Component Value Date   CHOLHDL 5.6 (H) 07/31/2019   Lab Results  Component Value Date   HGBA1C 8.5 (A) 01/29/2020   HGBA1C 8.5 01/29/2020   HGBA1C 8.5 (A) 01/29/2020   HGBA1C 8.5 (A) 01/29/2020      Assessment & Plan:   1. Anxiety We will initiate trial of Alprazolam because of increase grief today. Monitor.  - ALPRAZolam (XANAX) 0.5 MG tablet; Take 1 tablet (0.5 mg total) by mouth at bedtime as needed.  Dispense: 30 tablet; Refill: 0  2. Feeling grief  3. Prediabetes - Urinalysis Dipstick - Glucose (CBG) - HgB A1c  4. Type 2 diabetes mellitus with hyperglycemia, without long-term current use of insulin (HCC) He will continue medication as prescribed, to decrease foods/beverages high in sugars and carbs and follow Heart Healthy or DASH diet. Increase physical activity to at least 30 minutes cardio exercise daily.   5. Hemoglobin A1C between 7% and 9% indicating borderline diabetic control Hgb A1c increased at 834 today, from 6.8 on 07/31/2019. Monitor.   6. Moderate asthma without complication, unspecified whether persistent - albuterol (PROVENTIL) (2.5 MG/3ML) 0.083% nebulizer solution; Take 3 mLs (2.5 mg total) by nebulization every 6 (six) hours as needed for wheezing or shortness of breath.  Dispense: 75 mL; Refill: 12  7. Shortness of breath Stable. No signs or symptoms of respiratory distress noted or reported.   8. Class 2 severe obesity due to excess calories with serious comorbidity and body mass index (BMI) of 38.0 to 38.9 in adult [facility]) Body mass index is 43.71 kg/m. Goal BMI  is <30. Encouraged efforts to reduce weight include engaging in physical activity as tolerated with goal of 150 minutes per week.  Improve dietary choices and eat a meal regimen consistent with a Mediterranean or DASH diet. Reduce simple carbohydrates. Do not skip meals and eat healthy snacks throughout the day to avoid over-eating at dinner. Set a goal weight loss that is achievable for  you.   9. Follow up He will follow up in 6 months.   Meds ordered this encounter  Medications  . albuterol (PROVENTIL) (2.5 MG/3ML) 0.083% nebulizer solution    Sig: Take 3 mLs (2.5 mg total) by nebulization every 6 (six) hours as needed for wheezing or shortness of breath.    Dispense:  75 mL    Refill:  12  . ALPRAZolam (XANAX) 0.5 MG tablet    Sig: Take 1 tablet (0.5 mg total) by mouth at bedtime as needed.    Dispense:  30 tablet    Refill:  0    Order Specific Question:   Supervising Provider    Answer:   Tresa Garter [id number]    Orders Placed This Encounter  Procedures  . Urinalysis Dipstick  . Glucose (CBG)  . HgB A1c    Referral Orders  No referral(s) requested today    Kathe Becton,  MSN, FNP-BC [address], [place] [address] [phone] fax  Problem List Items Addressed This Visit    None    Visit Diagnoses    Anxiety    -  Primary   Relevant Medications   ALPRAZolam (XANAX) 0.5 MG tablet   Feeling grief       Prediabetes       Relevant Orders   Urinalysis Dipstick (Completed)   Glucose (CBG) (Completed)   HgB A1c (Completed)   Type 2 diabetes mellitus with hyperglycemia, without long-term current use of insulin (HCC)       Relevant Medications   albuterol (PROVENTIL) (2.5 MG/3ML) 0.083% nebulizer solution   Hemoglobin A1C between 7% and 9% indicating borderline diabetic control       Moderate asthma without complication, unspecified whether persistent       Relevant Medications   albuterol (PROVENTIL) (2.5 MG/3ML) 0.083% nebulizer solution   Shortness of breath       Class 2 severe obesity due to excess calories with serious comorbidity and body mass index (BMI) of 38.0 to 38.9 in adult [facility])       Follow up          Meds ordered this encounter  Medications  . albuterol (PROVENTIL) (2.5 MG/3ML) 0.083%  nebulizer solution    Sig: Take 3 mLs (2.5 mg total) by nebulization every 6 (six) hours as needed for wheezing or shortness of breath.    Dispense:  75 mL    Refill:  12  . ALPRAZolam (XANAX) 0.5 MG tablet    Sig: Take 1 tablet (0.5 mg total) by mouth at bedtime as needed.    Dispense:  30 tablet    Refill:  0    Order Specific Question:   Supervising Provider    Answer:   Tresa Garter [id number]    Follow-up: Return in about 6 months (around 07/30/2020).    Azzie Glatter, FNP

## 2020-02-01 ENCOUNTER — Encounter: Payer: Self-pay | Admitting: Family Medicine

## 2020-02-01 DIAGNOSIS — F419 Anxiety disorder, unspecified: Secondary | ICD-10-CM | POA: Insufficient documentation

## 2020-02-01 DIAGNOSIS — F4321 Adjustment disorder with depressed mood: Secondary | ICD-10-CM | POA: Insufficient documentation

## 2020-02-03 ENCOUNTER — Other Ambulatory Visit: Payer: Self-pay | Admitting: Family Medicine

## 2020-02-03 DIAGNOSIS — J4520 Mild intermittent asthma, uncomplicated: Secondary | ICD-10-CM

## 2020-02-25 ENCOUNTER — Other Ambulatory Visit: Payer: Self-pay | Admitting: Family Medicine

## 2020-05-10 ENCOUNTER — Other Ambulatory Visit: Payer: Self-pay | Admitting: Family Medicine

## 2020-05-10 DIAGNOSIS — E559 Vitamin D deficiency, unspecified: Secondary | ICD-10-CM

## 2020-05-12 ENCOUNTER — Other Ambulatory Visit: Payer: Self-pay | Admitting: Family Medicine

## 2020-05-12 DIAGNOSIS — K047 Periapical abscess without sinus: Secondary | ICD-10-CM

## 2020-05-12 DIAGNOSIS — K0889 Other specified disorders of teeth and supporting structures: Secondary | ICD-10-CM

## 2020-06-13 ENCOUNTER — Other Ambulatory Visit: Payer: Self-pay | Admitting: Family Medicine

## 2020-06-13 DIAGNOSIS — K0889 Other specified disorders of teeth and supporting structures: Secondary | ICD-10-CM

## 2020-06-13 DIAGNOSIS — K047 Periapical abscess without sinus: Secondary | ICD-10-CM

## 2020-06-14 NOTE — Telephone Encounter (Signed)
Please see RX request.   

## 2020-07-08 ENCOUNTER — Other Ambulatory Visit: Payer: Self-pay | Admitting: Family Medicine

## 2020-07-08 DIAGNOSIS — E782 Mixed hyperlipidemia: Secondary | ICD-10-CM

## 2020-07-08 DIAGNOSIS — E785 Hyperlipidemia, unspecified: Secondary | ICD-10-CM

## 2020-07-29 ENCOUNTER — Ambulatory Visit: Payer: BLUE CROSS/BLUE SHIELD | Admitting: Family Medicine

## 2020-07-29 ENCOUNTER — Other Ambulatory Visit: Payer: Self-pay | Admitting: Family Medicine

## 2020-07-29 DIAGNOSIS — K0889 Other specified disorders of teeth and supporting structures: Secondary | ICD-10-CM

## 2020-07-29 DIAGNOSIS — K047 Periapical abscess without sinus: Secondary | ICD-10-CM

## 2020-07-29 NOTE — Telephone Encounter (Signed)
Is this okay to refill? 

## 2020-08-01 ENCOUNTER — Other Ambulatory Visit: Payer: Self-pay | Admitting: Family Medicine

## 2020-08-01 DIAGNOSIS — J3089 Other allergic rhinitis: Secondary | ICD-10-CM

## 2020-08-05 IMAGING — CR LEFT RIBS AND CHEST - 3+ VIEW
5 series · 5 of 5 positions shown · non-contrast
Comparison: PA and lateral chest 08/16/2018.

CLINICAL DATA: Left chest pain loading a truck since a blow to the
chest 2 weeks ago. Initial encounter.

EXAM:
LEFT RIBS AND CHEST - 3+ VIEW

[chest pa]
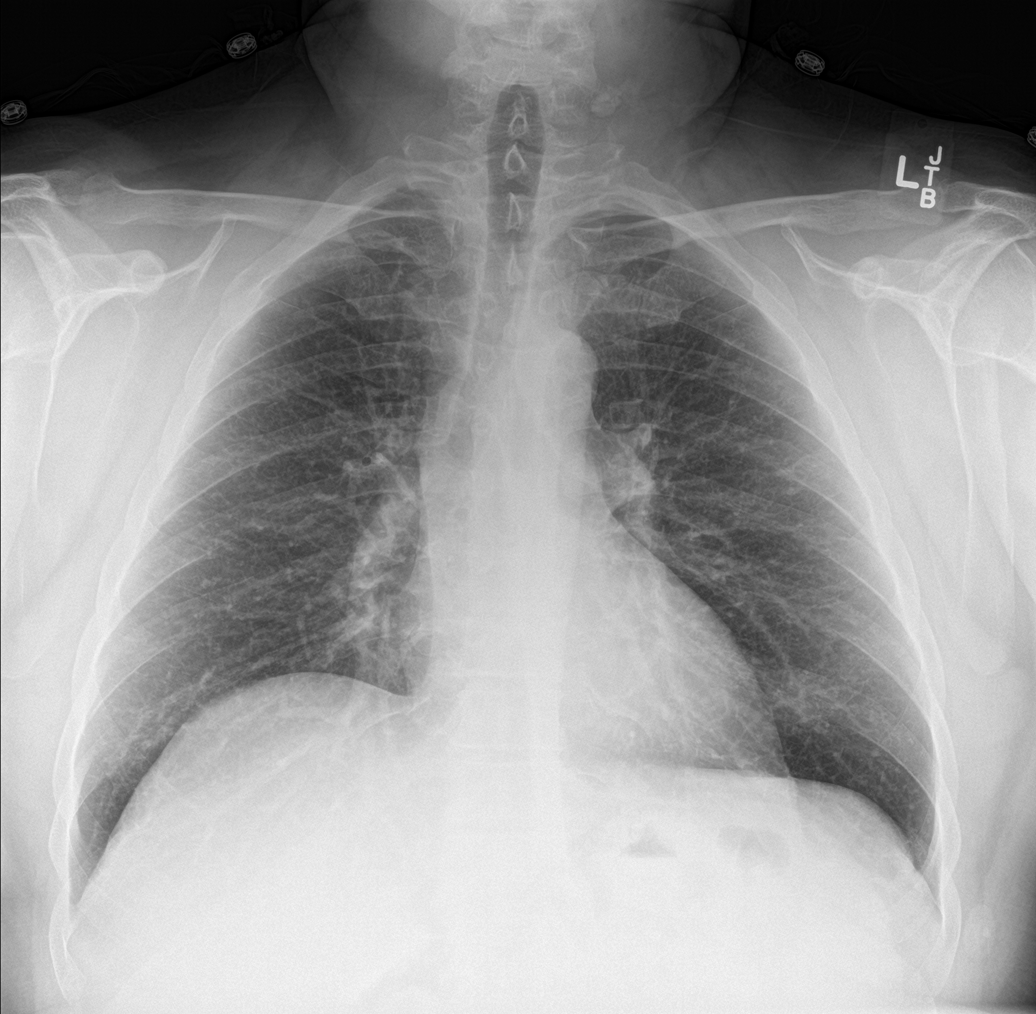

[rib pa obl (1 of 2)]
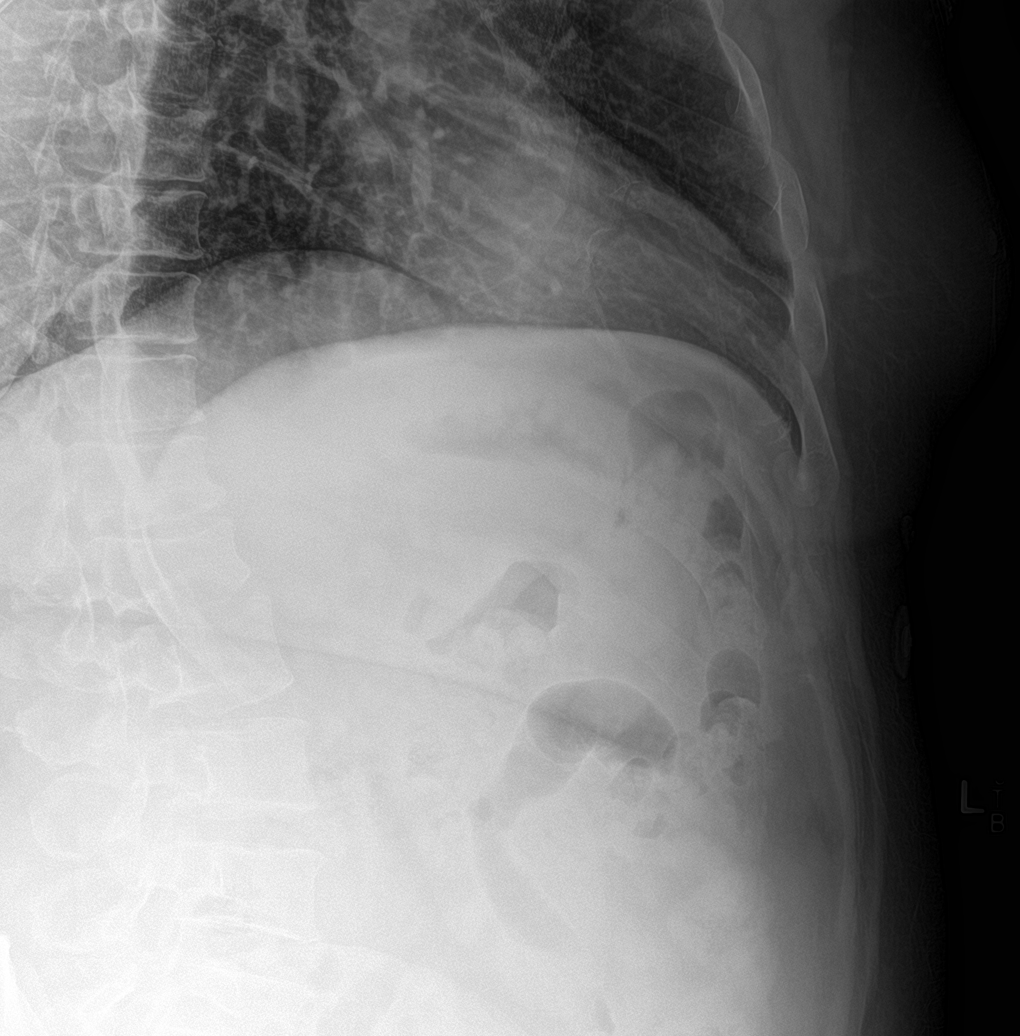

[rib pa obl (2 of 2)]
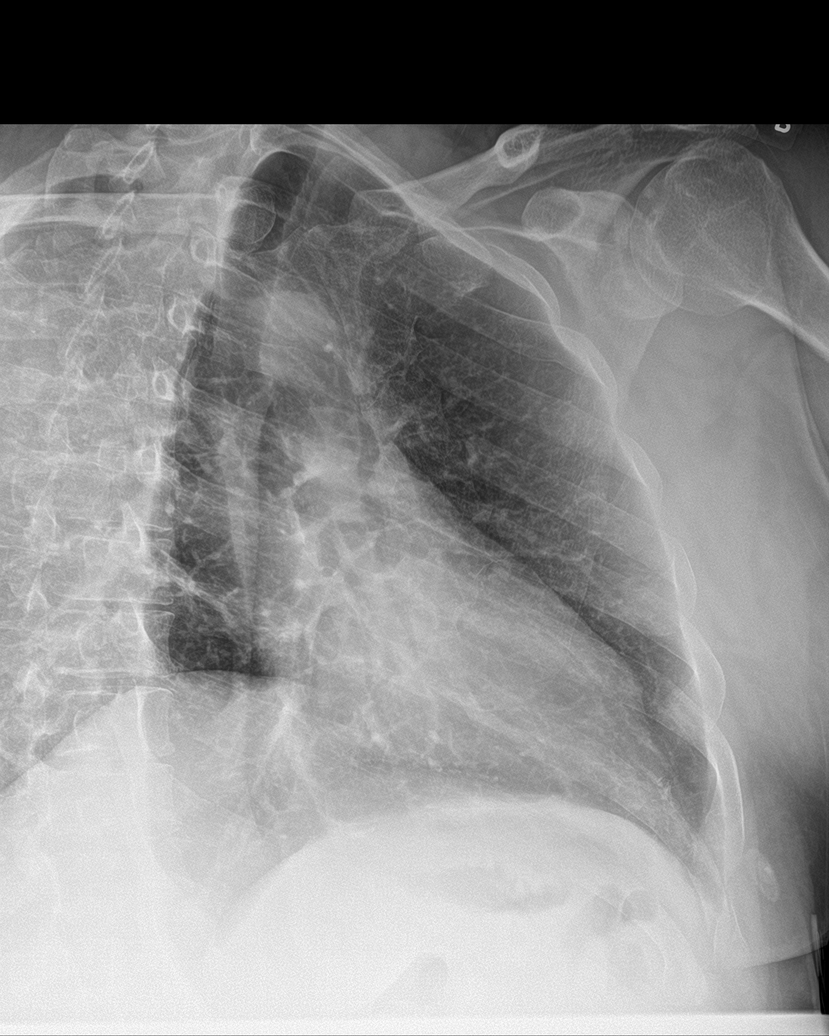

[rib pa (1 of 2)]
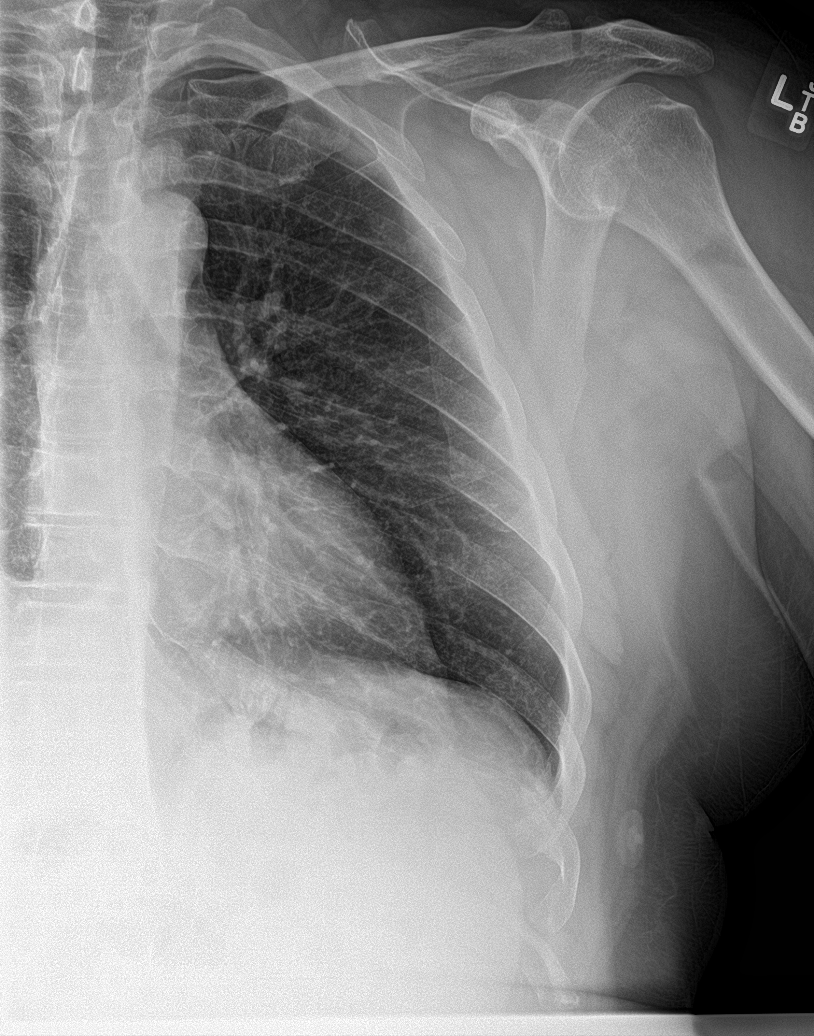

[rib pa (2 of 2)]
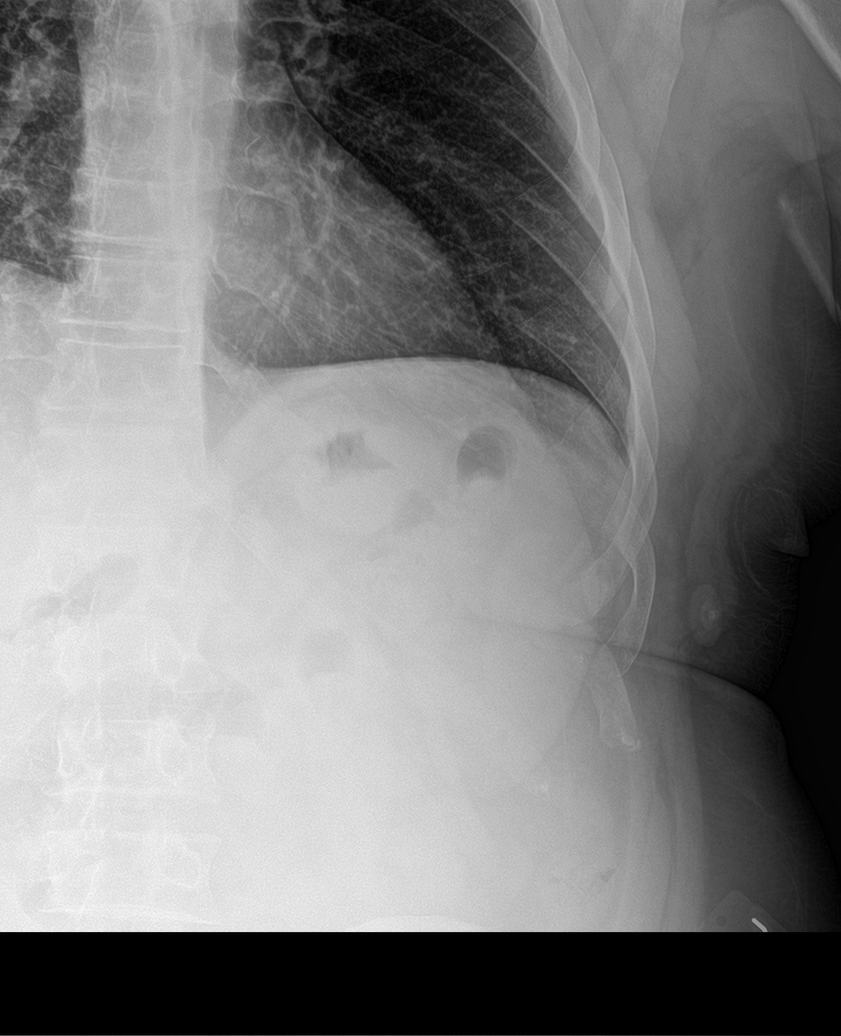

[5 of 5 positions shown; findings below may reference images not displayed]

FINDINGS: Lungs are clear. No pneumothorax or pleural effusion. Heart size is
normal. No fracture or other focal bony abnormality.
IMPRESSION: Negative for fracture.  No acute disease.

## 2020-08-19 ENCOUNTER — Ambulatory Visit (INDEPENDENT_AMBULATORY_CARE_PROVIDER_SITE_OTHER): Payer: BLUE CROSS/BLUE SHIELD | Admitting: Family Medicine

## 2020-08-19 ENCOUNTER — Other Ambulatory Visit: Payer: Self-pay

## 2020-08-19 ENCOUNTER — Encounter: Payer: Self-pay | Admitting: Family Medicine

## 2020-08-19 VITALS — BP 142/87 | HR 83 | Temp 97.3°F | Ht 69.0 in | Wt 278.0 lb

## 2020-08-19 DIAGNOSIS — E1165 Type 2 diabetes mellitus with hyperglycemia: Secondary | ICD-10-CM | POA: Diagnosis not present

## 2020-08-19 DIAGNOSIS — J45909 Unspecified asthma, uncomplicated: Secondary | ICD-10-CM

## 2020-08-19 DIAGNOSIS — R739 Hyperglycemia, unspecified: Secondary | ICD-10-CM | POA: Diagnosis not present

## 2020-08-19 DIAGNOSIS — Z09 Encounter for follow-up examination after completed treatment for conditions other than malignant neoplasm: Secondary | ICD-10-CM

## 2020-08-19 DIAGNOSIS — F419 Anxiety disorder, unspecified: Secondary | ICD-10-CM

## 2020-08-19 DIAGNOSIS — E119 Type 2 diabetes mellitus without complications: Secondary | ICD-10-CM

## 2020-08-19 DIAGNOSIS — R7303 Prediabetes: Secondary | ICD-10-CM | POA: Diagnosis not present

## 2020-08-19 DIAGNOSIS — Z0000 Encounter for general adult medical examination without abnormal findings: Secondary | ICD-10-CM

## 2020-08-19 DIAGNOSIS — R634 Abnormal weight loss: Secondary | ICD-10-CM | POA: Diagnosis not present

## 2020-08-19 DIAGNOSIS — Z6841 Body Mass Index (BMI) 40.0 and over, adult: Secondary | ICD-10-CM

## 2020-08-19 NOTE — Progress Notes (Signed)
[facility]  . Established Patient Office Visit  Subjective:  Patient ID: Cole Morales, male    DOB: 09-07-1975  Age: 45 y.o. MRN: [id number]  CC:  Chief Complaint  Patient presents with  . Follow-up    6 month follow up , need a refill on Vit d , discuss changes in medication Xanax pt said that 0.25mg  now .    HPI Cole Morales is a 45 year old male who presents for Follow Up today.   Patient Active Problem List   Diagnosis Date Noted  . Feeling grief 02/01/2020  . Anxiety 02/01/2020  . Muscle spasm 01/29/2019  . Sleep apnea 01/29/2019  . Chest discomfort 10/28/2018  . Left shoulder pain 10/28/2018  . Gastroesophageal reflux disease with esophagitis 10/28/2018   Current Status: Since his last office visit, he is doing well with no complaints. His anxiety is stable today. He denies suicidal ideations, homicidal ideations, or auditory hallucinations. He denies fevers, chills, fatigue, recent infections, weight loss, and night sweats. He has not had any headaches, visual changes, dizziness, and falls. No chest pain, heart palpitations, cough and shortness of breath reported. Denies GI problems such as nausea, vomiting, diarrhea, and constipation. He has no reports of blood in stools, dysuria and hematuria. He is taking all medications as prescribed. He denies pain today.   Past Medical History:  Diagnosis Date  . Anxiety 01/2020  . Asthma   . GERD (gastroesophageal reflux disease)   . Grief 01/2020  . Hyperlipidemia, mixed 07/2019  . Obesity   . Vitamin D deficiency 07/2019    Past Surgical History:  Procedure Laterality Date  . MOUTH SURGERY      Family History  Problem Relation Age of Onset  . Asthma Mother   . Cancer Maternal Grandmother   . Cancer Maternal Grandfather     Social History   Socioeconomic History  . Marital status: Single    Spouse name: Not on file  . Number of children: Not on file  .  Years of education: Not on file  . Highest education level: Not on file  Occupational History  . Not on file  Tobacco Use  . Smoking status: Former Smoker    Types: Cigarettes    Quit date: 01/19/2018    Years since quitting: 2.5  . Smokeless tobacco: Never Used  Vaping Use  . Vaping Use: Never used  Substance and Sexual Activity  . Alcohol use: Yes  . Drug use: Yes    Types: Marijuana  . Sexual activity: Not Currently  Other Topics Concern  . Not on file  Social History Narrative  . Not on file   Social Determinants of Health   Financial Resource Strain: Not on file  Food Insecurity: Not on file  Transportation Needs: Not on file  Physical Activity: Not on file  Stress: Not on file  Social Connections: Not on file  Intimate Partner Violence: Not on file    Outpatient Medications Prior to Visit  Medication Sig Dispense Refill  . albuterol (PROVENTIL) (2.5 MG/3ML) 0.083% nebulizer solution Take 3 mLs (2.5 mg total) by nebulization every 6 (six) hours as needed for wheezing or shortness of breath. 75 mL 12  . albuterol (VENTOLIN HFA) 108 (90 Base) MCG/ACT inhaler Inhale 1-2 puffs into the lungs every 6 (six) hours as needed for wheezing or shortness of breath. 1 Inhaler 0  . atorvastatin (LIPITOR) 20 MG tablet TAKE 1 TABLET(20 MG) BY  MOUTH DAILY 90 tablet 1  . budesonide-formoterol (SYMBICORT) 80-4.5 MCG/ACT inhaler INHALE 2 PUFFS INTO THE LUNGS TWICE DAILY 10.2 g 6  . cetirizine (ZYRTEC) 10 MG tablet TAKE 1 TABLET(10 MG) BY MOUTH DAILY 30 tablet 11  . fluticasone (FLONASE) 50 MCG/ACT nasal spray Place 2 sprays into both nostrils daily. 16 g 6  . ibuprofen (ADVIL) 800 MG tablet TAKE 1 TABLET(800 MG) BY MOUTH EVERY 8 HOURS AS NEEDED 60 tablet 6  . omeprazole (PRILOSEC) 40 MG capsule TAKE 1 CAPSULE(40 MG) BY MOUTH TWICE DAILY 60 capsule 6  . cyclobenzaprine (FLEXERIL) 10 MG tablet Take 1 tablet (10 mg total) by mouth 3 (three) times daily as needed for muscle spasms. (Patient not  taking: Reported on 08/19/2020) 30 tablet 3  . guaiFENesin (MUCINEX) 600 MG 12 hr tablet Take 1 tablet (600 mg total) by mouth 2 (two) times daily. (Patient not taking: Reported on 08/19/2020) 30 tablet 1  . Vitamin D, Ergocalciferol, (DRISDOL) 1.25 MG (50000 UNIT) CAPS capsule TAKE 1 CAPSULE BY MOUTH EVERY 7 DAYS (Patient not taking: Reported on 08/19/2020) 5 capsule 6   No facility-administered medications prior to visit.    No Known Allergies  ROS Review of Systems  Constitutional: Negative.   HENT: Negative.   Eyes: Negative.   Respiratory: Positive for shortness of breath (occasional ).   Cardiovascular: Negative.   Gastrointestinal: Positive for abdominal distention (obese).  Endocrine: Negative.   Genitourinary: Negative.   Musculoskeletal: Positive for arthralgias (generalized).  Skin: Negative.   Allergic/Immunologic: Negative.   Neurological: Positive for dizziness (occasional ) and headaches (occasional ).  Hematological: Negative.   Psychiatric/Behavioral: Negative.       Objective:    Physical Exam Vitals and nursing note reviewed.  Constitutional:      Appearance: Normal appearance.  HENT:     Head: Normocephalic and atraumatic.     Nose: Nose normal.     Mouth/Throat:     Mouth: Mucous membranes are moist.     Pharynx: Oropharynx is clear.  Cardiovascular:     Rate and Rhythm: Normal rate and regular rhythm.     Pulses: Normal pulses.     Heart sounds: Normal heart sounds.  Pulmonary:     Effort: Pulmonary effort is normal.     Breath sounds: Normal breath sounds.  Abdominal:     General: Bowel sounds are normal. There is distension (obese).     Palpations: Abdomen is soft.  Musculoskeletal:        General: Normal range of motion.     Cervical back: Normal range of motion and neck supple.  Skin:    General: Skin is warm and dry.  Neurological:     General: No focal deficit present.     Mental Status: He is alert and oriented to person, place, and  time.  Psychiatric:        Mood and Affect: Mood normal.        Behavior: Behavior normal.        Thought Content: Thought content normal.        Judgment: Judgment normal.     BP (!) 142/87 (BP Location: Left Arm, Patient Position: Sitting, Cuff Size: Large)   Pulse 83   Temp (!) 97.3 F (36.3 C) (Temporal)   Ht 5\' 9"  (1.753 m)   Wt 278 lb (126.1 kg)   SpO2 99%   BMI 41.05 kg/m  Wt Readings from Last 3 Encounters:  08/19/20 278 lb (126.1 kg)  01/29/20 296 lb 0.2 oz (134.3 kg)  07/31/19 291 lb 6.4 oz (132.2 kg)     Health Maintenance Due  Topic Date Due  . Hepatitis C Screening  Never done  . COVID-19 Vaccine (1) Never done  . URINE MICROALBUMIN  Never done  . HIV Screening  Never done  . TETANUS/TDAP  Never done  . COLONOSCOPY (Pts 45-49yrs Insurance coverage will need to be confirmed)  Never done  . INFLUENZA VACCINE  Never done    There are no preventive care reminders to display for this patient.  Lab Results  Component Value Date   TSH 1.790 07/31/2019   Lab Results  Component Value Date   WBC 7.3 07/31/2019   HGB 15.3 07/31/2019   HCT 45.2 07/31/2019   MCV 82 07/31/2019   PLT 226 07/31/2019   Lab Results  Component Value Date   NA 140 08/16/2018   K 3.9 08/16/2018   CO2 26 08/16/2018   GLUCOSE 111 (H) 08/16/2018   BUN 7 08/16/2018   CREATININE 0.92 08/16/2018   BILITOT 0.3 07/23/2018   ALKPHOS 88 07/23/2018   AST 32 07/23/2018   ALT 54 (H) 07/23/2018   PROT 7.3 07/23/2018   ALBUMIN 4.6 07/23/2018   CALCIUM 9.5 08/16/2018   ANIONGAP 9 08/16/2018   Lab Results  Component Value Date   CHOL 139 07/31/2019   Lab Results  Component Value Date   HDL 25 (L) 07/31/2019   Lab Results  Component Value Date   LDLCALC 66 07/31/2019   Lab Results  Component Value Date   TRIG 298 (H) 07/31/2019   Lab Results  Component Value Date   CHOLHDL 5.6 (H) 07/31/2019   Lab Results  Component Value Date   HGBA1C 8.5 (A) 01/29/2020   HGBA1C 8.5  01/29/2020   HGBA1C 8.5 (A) 01/29/2020   HGBA1C 8.5 (A) 01/29/2020    Assessment & Plan:   1. Weight loss 20 lb weight loss in 7 months.   2. Type 2 diabetes mellitus with hyperglycemia, without long-term current use of insulin (HCC) He will continue medication as prescribed, to decrease foods/beverages high in sugars and carbs and follow Heart Healthy or DASH diet. Increase physical activity to at least 30 minutes cardio exercise daily.  - Hemoglobin A1c  3. Hemoglobin A1C greater than 9% indicating uncontrolled diabetic control Hgb A1c at 9.2 today. Monitor.  - Hemoglobin A1c  4. Hyperglycemia - Hemoglobin A1c  5. Anxiety  6. Moderate asthma without complication, unspecified whether persistent Stable. No signs and symptoms for respiratory distress noted or reported today. Continue treatment regimen as prescribed.   7. Class 3 severe obesity due to excess calories with serious comorbidity and body mass index (BMI) of 40.0 to 44.9 in adult [facility]) Body mass index is 41.05 kg/m. Goal BMI  is <30. Encouraged efforts to reduce weight include engaging in physical activity as tolerated with goal of 150 minutes per week. Improve dietary choices and eat a meal regimen consistent with a Mediterranean or DASH diet. Reduce simple carbohydrates. Do not skip meals and eat healthy snacks throughout the day to avoid over-eating at dinner. Set a goal weight loss that is achievable for you.  8. Healthcare maintenance - CBC with Differential - Comprehensive metabolic panel - Lipid Panel - TSH - Vitamin B12 - Vitamin D, 25-hydroxy - Hemoglobin A1c  9. Follow up He will follow up in 6 months.   No orders of the defined types were placed in this encounter.  Orders Placed This Encounter  Procedures  . CBC with Differential  . Comprehensive metabolic panel  . Lipid Panel  . TSH  . Vitamin B12  . Vitamin D, 25-hydroxy  . Hemoglobin A1c    Raliegh Ip, MSN, ANE, FNP-BC [facility]  [facility] [address] [place], [place] [address] [phone] fax  Problem List Items Addressed This Visit      Other   Anxiety    Other Visit Diagnoses    Weight loss    -  Primary   Type 2 diabetes mellitus with hyperglycemia, without long-term current use of insulin (HCC)       Relevant Orders   Hemoglobin A1c   Hemoglobin A1C between 7% and 9% indicating borderline diabetic control       Relevant Orders   Hemoglobin A1c   Hyperglycemia       Relevant Orders   Hemoglobin A1c   Moderate asthma without complication, unspecified whether persistent       Class 3 severe obesity due to excess calories with serious comorbidity and body mass index (BMI) of 40.0 to 44.9 in adult [facility])       Healthcare maintenance       Relevant Orders   CBC with Differential   Comprehensive metabolic panel   Lipid Panel   TSH   Vitamin B12   Vitamin D, 25-hydroxy   Hemoglobin A1c   Follow up          No orders of the defined types were placed in this encounter.   Follow-up: No follow-ups on file.    Kallie Locks, FNP

## 2020-08-20 LAB — CBC WITH DIFFERENTIAL/PLATELET
Basophils Absolute: 0.1 10*3/uL (ref 0.0–0.2)
Basos: 1 %
EOS (ABSOLUTE): 0.1 10*3/uL (ref 0.0–0.4)
Eos: 2 %
Hematocrit: 46.4 % (ref 37.5–51.0)
Hemoglobin: 15.5 g/dL (ref 13.0–17.7)
Immature Grans (Abs): 0 10*3/uL (ref 0.0–0.1)
Immature Granulocytes: 0 %
Lymphocytes Absolute: 2 10*3/uL (ref 0.7–3.1)
Lymphs: 28 %
MCH: 27.3 pg (ref 26.6–33.0)
MCHC: 33.4 g/dL (ref 31.5–35.7)
MCV: 82 fL (ref 79–97)
Monocytes Absolute: 0.5 10*3/uL (ref 0.1–0.9)
Monocytes: 8 %
Neutrophils Absolute: 4.4 10*3/uL (ref 1.4–7.0)
Neutrophils: 61 %
Platelets: 245 10*3/uL (ref 150–450)
RBC: 5.68 x10E6/uL (ref 4.14–5.80)
RDW: 12.5 % (ref 11.6–15.4)
WBC: 7.1 10*3/uL (ref 3.4–10.8)

## 2020-08-20 LAB — COMPREHENSIVE METABOLIC PANEL
ALT: 71 IU/L — ABNORMAL HIGH (ref 0–44)
AST: 47 IU/L — ABNORMAL HIGH (ref 0–40)
Albumin/Globulin Ratio: 1.6 (ref 1.2–2.2)
Albumin: 4.8 g/dL (ref 4.0–5.0)
Alkaline Phosphatase: 130 IU/L — ABNORMAL HIGH (ref 44–121)
BUN/Creatinine Ratio: 7 — ABNORMAL LOW (ref 9–20)
BUN: 7 mg/dL (ref 6–24)
Bilirubin Total: 0.5 mg/dL (ref 0.0–1.2)
CO2: 27 mmol/L (ref 20–29)
Calcium: 10 mg/dL (ref 8.7–10.2)
Chloride: 99 mmol/L (ref 96–106)
Creatinine, Ser: 1.05 mg/dL (ref 0.76–1.27)
GFR calc Af Amer: 99 mL/min/{1.73_m2} (ref >59)
GFR calc non Af Amer: 85 mL/min/{1.73_m2} (ref >59)
Globulin, Total: 3 g/dL (ref 1.5–4.5)
Glucose: 187 mg/dL — ABNORMAL HIGH (ref 65–99)
Potassium: 4.4 mmol/L (ref 3.5–5.2)
Sodium: 140 mmol/L (ref 134–144)
Total Protein: 7.8 g/dL (ref 6.0–8.5)

## 2020-08-20 LAB — LIPID PANEL
Chol/HDL Ratio: 5.8 ratio — ABNORMAL HIGH (ref 0.0–5.0)
Cholesterol, Total: 168 mg/dL (ref 100–199)
HDL: 29 mg/dL — ABNORMAL LOW (ref >39)
LDL Chol Calc (NIH): 104 mg/dL — ABNORMAL HIGH (ref 0–99)
Triglycerides: 203 mg/dL — ABNORMAL HIGH (ref 0–149)
VLDL Cholesterol Cal: 35 mg/dL (ref 5–40)

## 2020-08-20 LAB — VITAMIN D 25 HYDROXY (VIT D DEFICIENCY, FRACTURES): Vit D, 25-Hydroxy: 26.7 ng/mL — ABNORMAL LOW (ref 30.0–100.0)

## 2020-08-20 LAB — TSH: TSH: 2.11 u[IU]/mL (ref 0.450–4.500)

## 2020-08-20 LAB — VITAMIN B12: Vitamin B-12: 452 pg/mL (ref 232–1245)

## 2020-08-20 LAB — HEMOGLOBIN A1C
Est. average glucose Bld gHb Est-mCnc: 217 mg/dL
Hgb A1c MFr Bld: 9.2 % — ABNORMAL HIGH (ref 4.8–5.6)

## 2020-08-22 ENCOUNTER — Telehealth: Payer: Self-pay

## 2020-08-22 NOTE — Telephone Encounter (Signed)
Patient will not be getting covid vaccines at this time.

## 2020-08-25 ENCOUNTER — Encounter: Payer: Self-pay | Admitting: Family Medicine

## 2020-09-19 ENCOUNTER — Other Ambulatory Visit: Payer: Self-pay | Admitting: Family Medicine

## 2020-09-19 DIAGNOSIS — J4520 Mild intermittent asthma, uncomplicated: Secondary | ICD-10-CM

## 2020-10-28 ENCOUNTER — Other Ambulatory Visit: Payer: Self-pay | Admitting: Family Medicine

## 2021-02-20 ENCOUNTER — Encounter: Payer: Self-pay | Admitting: Nurse Practitioner

## 2021-02-20 ENCOUNTER — Other Ambulatory Visit: Payer: Self-pay

## 2021-02-20 ENCOUNTER — Ambulatory Visit (INDEPENDENT_AMBULATORY_CARE_PROVIDER_SITE_OTHER): Payer: BLUE CROSS/BLUE SHIELD | Admitting: Nurse Practitioner

## 2021-02-20 ENCOUNTER — Ambulatory Visit: Payer: BLUE CROSS/BLUE SHIELD | Admitting: Family Medicine

## 2021-02-20 VITALS — BP 138/90 | HR 97 | Temp 99.5°F | Ht 69.0 in | Wt 275.0 lb

## 2021-02-20 DIAGNOSIS — Z114 Encounter for screening for human immunodeficiency virus [HIV]: Secondary | ICD-10-CM

## 2021-02-20 DIAGNOSIS — E1165 Type 2 diabetes mellitus with hyperglycemia: Secondary | ICD-10-CM

## 2021-02-20 DIAGNOSIS — R8290 Unspecified abnormal findings in urine: Secondary | ICD-10-CM | POA: Diagnosis not present

## 2021-02-20 DIAGNOSIS — J3089 Other allergic rhinitis: Secondary | ICD-10-CM

## 2021-02-20 DIAGNOSIS — E785 Hyperlipidemia, unspecified: Secondary | ICD-10-CM

## 2021-02-20 DIAGNOSIS — Z131 Encounter for screening for diabetes mellitus: Secondary | ICD-10-CM

## 2021-02-20 DIAGNOSIS — E782 Mixed hyperlipidemia: Secondary | ICD-10-CM

## 2021-02-20 DIAGNOSIS — Z6841 Body Mass Index (BMI) 40.0 and over, adult: Secondary | ICD-10-CM

## 2021-02-20 DIAGNOSIS — Z1159 Encounter for screening for other viral diseases: Secondary | ICD-10-CM

## 2021-02-20 LAB — POCT URINALYSIS DIPSTICK
Bilirubin, UA: NEGATIVE
Glucose, UA: POSITIVE — AB
Ketones, UA: NEGATIVE
Nitrite, UA: NEGATIVE
Protein, UA: POSITIVE — AB
Spec Grav, UA: 1.025 (ref 1.010–1.025)
Urobilinogen, UA: 0.2 E.U./dL
pH, UA: 5.5 (ref 5.0–8.0)

## 2021-02-20 LAB — POCT GLYCOSYLATED HEMOGLOBIN (HGB A1C)
HbA1c POC (<> result, manual entry): 11.2 % (ref 4.0–5.6)
HbA1c, POC (controlled diabetic range): 11.2 % — AB (ref 0.0–7.0)
HbA1c, POC (prediabetic range): 11.2 % — AB (ref 5.7–6.4)
Hemoglobin A1C: 11.2 % — AB (ref 4.0–5.6)

## 2021-02-20 MED ORDER — ALBUTEROL SULFATE HFA 108 (90 BASE) MCG/ACT IN AERS: 1.0000 | INHALATION_SPRAY | Freq: Four times a day (QID) | RESPIRATORY_TRACT | 0 refills | Status: DC | PRN

## 2021-02-20 MED ORDER — METFORMIN HCL 500 MG PO TABS: ORAL_TABLET | ORAL | 3 refills | Status: DC

## 2021-02-20 MED ORDER — ATORVASTATIN CALCIUM 20 MG PO TABS: 20.0000 mg | ORAL_TABLET | Freq: Every day | ORAL | 3 refills | Status: DC

## 2021-02-20 MED ORDER — BLOOD GLUCOSE MONITOR KIT: PACK | 0 refills | Status: AC

## 2021-02-20 MED ORDER — METFORMIN HCL 500 MG PO TABS: 1000.0000 mg | ORAL_TABLET | Freq: Two times a day (BID) | ORAL | 3 refills | Status: DC

## 2021-02-20 MED ORDER — FLUTICASONE PROPIONATE 50 MCG/ACT NA SUSP: 2.0000 | Freq: Every day | NASAL | 6 refills | Status: AC

## 2021-02-20 NOTE — Progress Notes (Signed)
[facility], [place]  [address] Phone:  [phone]   Fax:  [phone]   Established Patient Office Visit  Subjective:  Patient ID: Simuel Stebner, male    DOB: May 23, 1976  Age: 45 y.o. MRN: [id number]  CC:  Chief Complaint  Patient presents with   Follow-up    Left ear pain, started about 1 week ago.     HPI [facility] presents for follow up. A former patient of NP Stroud.  He  has a past medical history of Anxiety (01/2020), Asthma, GERD (gastroesophageal reflux disease), Grief (01/2020), Hyperlipidemia, mixed (07/2019), Obesity, and Vitamin D deficiency (07/2019).    Ear Pain Patient complains of left ear pain. Symptoms have been present 2 weeks. He also notes no ear related symptoms. He does not have a history of ear infections. He does not have a history of recent swimming. He has tried  to use an old remedy with not relief.   for his symptoms. He has been recently treated with home remedies. He has tried to see if the   Diabetes Mellitus Patient presents for follow up of diabetes. Current symptoms include: hyperglycemia. Symptoms have stabilized. Patient denies polydipsia. Evaluation to date has included: hemoglobin A1C.  Home sugars: patient does not check sugars. Current treatment: more intensive attention to diet which has been ineffective. Last dilated eye exam: several years. He is SP left eye surgery for metal.  He reports that he is not taking any medication for diabetes despite his previous medical records indicating that he was taking medication.  Past Medical History:  Diagnosis Date   Anxiety 01/2020   Asthma    GERD (gastroesophageal reflux disease)    Grief 01/2020   Hyperlipidemia, mixed 07/2019   Obesity    Vitamin D deficiency 07/2019    Past Surgical History:  Procedure Laterality Date   MOUTH SURGERY      Family History  Problem Relation Age of Onset   Asthma Mother    Cancer Maternal Grandmother     Cancer Maternal Grandfather     Social History   Socioeconomic History   Marital status: Single    Spouse name: Not on file   Number of children: Not on file   Years of education: Not on file   Highest education level: Not on file  Occupational History   Not on file  Tobacco Use   Smoking status: Former    Types: Cigarettes    Quit date: 01/19/2018    Years since quitting: 3.0   Smokeless tobacco: Never  Vaping Use   Vaping Use: Never used  Substance and Sexual Activity   Alcohol use: Yes   Drug use: Yes    Types: Marijuana   Sexual activity: Not Currently  Other Topics Concern   Not on file  Social History Narrative   Not on file   Social Determinants of Health   Financial Resource Strain: Not on file  Food Insecurity: Not on file  Transportation Needs: Not on file  Physical Activity: Not on file  Stress: Not on file  Social Connections: Not on file  Intimate Partner Violence: Not on file    Outpatient Medications Prior to Visit  Medication Sig Dispense Refill   albuterol (PROVENTIL) (2.5 MG/3ML) 0.083% nebulizer solution Take 3 mLs (2.5 mg total) by nebulization every 6 (six) hours as needed for wheezing or shortness of breath. 75 mL 12   budesonide-formoterol (SYMBICORT) 80-4.5 MCG/ACT inhaler INHALE  2 PUFFS INTO THE LUNGS TWICE DAILY 10.2 g 6   cetirizine (ZYRTEC) 10 MG tablet TAKE 1 TABLET(10 MG) BY MOUTH DAILY 30 tablet 11   ibuprofen (ADVIL) 800 MG tablet TAKE 1 TABLET(800 MG) BY MOUTH EVERY 8 HOURS AS NEEDED 60 tablet 6   omeprazole (PRILOSEC) 40 MG capsule TAKE 1 CAPSULE(40 MG) BY MOUTH TWICE DAILY 60 capsule 6   albuterol (VENTOLIN HFA) 108 (90 Base) MCG/ACT inhaler Inhale 1-2 puffs into the lungs every 6 (six) hours as needed for wheezing or shortness of breath. 1 Inhaler 0   atorvastatin (LIPITOR) 20 MG tablet TAKE 1 TABLET(20 MG) BY MOUTH DAILY 90 tablet 1   fluticasone (FLONASE) 50 MCG/ACT nasal spray Place 2 sprays into both nostrils daily. 16 g 6    guaiFENesin (MUCINEX) 600 MG 12 hr tablet Take 1 tablet (600 mg total) by mouth 2 (two) times daily. 30 tablet 1   budesonide-formoterol (SYMBICORT) 80-4.5 MCG/ACT inhaler INHALE 2 PUFFS INTO THE LUNGS TWICE DAILY 10.2 g 6   cyclobenzaprine (FLEXERIL) 10 MG tablet Take 1 tablet (10 mg total) by mouth 3 (three) times daily as needed for muscle spasms. (Patient not taking: Reported on 08/19/2020) 30 tablet 3   Vitamin D, Ergocalciferol, (DRISDOL) 1.25 MG (50000 UNIT) CAPS capsule TAKE 1 CAPSULE BY MOUTH EVERY 7 DAYS (Patient not taking: No sig reported) 5 capsule 6   No facility-administered medications prior to visit.    No Known Allergies  ROS Review of Systems  Respiratory:  Negative for shortness of breath.   Cardiovascular:  Negative for chest pain.     Objective:    Physical Exam Constitutional:      General: He is not in acute distress.    Appearance: He is obese. He is not ill-appearing, toxic-appearing or diaphoretic.  HENT:     Head: Normocephalic and atraumatic.     Right Ear: Tympanic membrane normal. There is no impacted cerumen.     Left Ear: Tympanic membrane normal. There is no impacted cerumen.     Nose: Nose normal.     Mouth/Throat:     Mouth: Mucous membranes are moist.  Cardiovascular:     Rate and Rhythm: Normal rate.     Pulses: Normal pulses.     Heart sounds: Normal heart sounds.  Pulmonary:     Effort: Pulmonary effort is normal.     Breath sounds: Normal breath sounds.  Musculoskeletal:        General: Normal range of motion.     Cervical back: Normal range of motion.  Skin:    General: Skin is warm.     Capillary Refill: Capillary refill takes less than 2 seconds.  Neurological:     General: No focal deficit present.     Mental Status: He is alert and oriented to person, place, and time.  Psychiatric:        Mood and Affect: Mood normal.        Behavior: Behavior normal.        Thought Content: Thought content normal.        Judgment: Judgment  normal.    BP 138/90 (BP Location: Right Arm, Patient Position: Sitting)   Pulse 97   Temp 99.5 F (37.5 C)   Ht $R'5\' 9"'uP$  (1.753 m)   Wt 275 lb 0.6 oz (124.8 kg)   SpO2 97%   BMI 40.62 kg/m  Wt Readings from Last 3 Encounters:  02/20/21 275 lb 0.6 oz (124.8 kg)  08/19/20  278 lb (126.1 kg)  01/29/20 296 lb 0.2 oz (134.3 kg)     There are no preventive care reminders to display for this patient.   There are no preventive care reminders to display for this patient.  Lab Results  Component Value Date   TSH 2.110 08/19/2020   Lab Results  Component Value Date   WBC 7.1 08/19/2020   HGB 15.5 08/19/2020   HCT 46.4 08/19/2020   MCV 82 08/19/2020   PLT 245 08/19/2020   Lab Results  Component Value Date   NA 140 08/19/2020   K 4.4 08/19/2020   CO2 27 08/19/2020   GLUCOSE 187 (H) 08/19/2020   BUN 7 08/19/2020   CREATININE 1.05 08/19/2020   BILITOT 0.5 08/19/2020   ALKPHOS 130 (H) 08/19/2020   AST 47 (H) 08/19/2020   ALT 71 (H) 08/19/2020   PROT 7.8 08/19/2020   ALBUMIN 4.8 08/19/2020   CALCIUM 10.0 08/19/2020   ANIONGAP 9 08/16/2018   Lab Results  Component Value Date   CHOL 168 08/19/2020   Lab Results  Component Value Date   HDL 29 (L) 08/19/2020   Lab Results  Component Value Date   LDLCALC 104 (H) 08/19/2020   Lab Results  Component Value Date   TRIG 203 (H) 08/19/2020   Lab Results  Component Value Date   CHOLHDL 5.8 (H) 08/19/2020   Lab Results  Component Value Date   HGBA1C 11.2 (A) 02/20/2021   HGBA1C 11.2 02/20/2021   HGBA1C 11.2 (A) 02/20/2021   HGBA1C 11.2 (A) 02/20/2021      Assessment & Plan:   Problem List Items Addressed This Visit   None Visit Diagnoses     Type 2 diabetes mellitus with hyperglycemia, without long-term current use of insulin ([facility])    -  Primary Uncontrolled due to non treatment Encourage compliance with current treatment regimen initiated metformin 500 mg twice daily for 7 days then 1000 mg twice  daily Encourage regular CBG monitoring Encourage contacting office if excessive hyperglycemia and or hypoglycemia Lifestyle modification with healthy diet (fewer calories, more high fiber foods, whole grains and non-starchy vegetables, lower fat meat and fish, low-fat diary include healthy oils) regular exercise (physical activity) and weight loss Opthalmology exam discussed  Nutritional consult recommended Regular dental visits encouraged Home BP monitoring also encouraged goal <130/80    Relevant Medications   atorvastatin (LIPITOR) 20 MG tablet   metFORMIN (GLUCOPHAGE) 500 MG tablet   Other Relevant Orders   Urinalysis Dipstick (Completed)   HgB A1c (Completed)   Comp. Metabolic Panel (12) (Completed)   Microalbumin, urine (Completed)   Abnormal urinalysis       Relevant Orders   Urine Culture (Completed)   Class 3 severe obesity due to excess calories with serious comorbidity and body mass index (BMI) of 40.0 to 44.9 in adult [facility])       Relevant Medications   metFORMIN (GLUCOPHAGE) 500 MG tablet   Screening for diabetes mellitus       Relevant Medications   fluticasone (FLONASE) 50 MCG/ACT nasal spray   Environmental and seasonal allergies       Hyperlipidemia, unspecified hyperlipidemia type       Relevant Medications   atorvastatin (LIPITOR) 20 MG tablet   Other Relevant Orders   Lipid panel (Completed)   Mixed hyperlipidemia     Stable labs pending for further evaluation encouraged heart healthy diet   Relevant Medications   atorvastatin (LIPITOR) 20 MG tablet   Encounter for  hepatitis C screening test for low risk patient       Relevant Orders   Hepatitis C antibody (Completed)   Encounter for screening for human immunodeficiency virus (HIV)       Relevant Orders   HIV Antibody (routine testing w rflx) (Completed)       Meds ordered this encounter  Medications   DISCONTD: metFORMIN (GLUCOPHAGE) 500 MG tablet    Sig: Take 2 tablets (1,000 mg total) by mouth 2  (two) times daily with a meal. I tab BID for one week then    Dispense:  180 tablet    Refill:  3    Order Specific Question:   Supervising Provider    Answer:   Tresa Garter [[id number]]   fluticasone (FLONASE) 50 MCG/ACT nasal spray    Sig: Place 2 sprays into both nostrils daily.    Dispense:  16 g    Refill:  6    Order Specific Question:   Supervising Provider    Answer:   Tresa Garter [[id number]]   atorvastatin (LIPITOR) 20 MG tablet    Sig: Take 1 tablet (20 mg total) by mouth daily.    Dispense:  90 tablet    Refill:  3    Order Specific Question:   Supervising Provider    Answer:   Tresa Garter [[id number]]   albuterol (VENTOLIN HFA) 108 (90 Base) MCG/ACT inhaler    Sig: Inhale 1-2 puffs into the lungs every 6 (six) hours as needed for wheezing or shortness of breath.    Dispense:  1 each    Refill:  0    Order Specific Question:   Supervising Provider    Answer:   Tresa Garter [id number]   blood glucose meter kit and supplies KIT    Sig: Dispense based on patient and insurance preference. Use up to four times daily as directed.    Dispense:  1 each    Refill:  0    Order Specific Question:   Supervising Provider    Answer:   Tresa Garter [[id number]]    Order Specific Question:   Number of strips    Answer:   100    Order Specific Question:   Number of lancets    Answer:   100   metFORMIN (GLUCOPHAGE) 500 MG tablet    Sig: Take 1 tablet (500 mg total) by mouth 2 (two) times daily with a meal for 7 days, THEN 2 tablets (1,000 mg total) 2 (two) times daily with a meal.    Dispense:  180 tablet    Refill:  3    Order Specific Question:   Supervising Provider    AnswerTresa Garter [[id number]]    Follow-up: Return in about 6 weeks (around 04/03/2021) for follow up with DOT physical .    Vevelyn Francois, NP

## 2021-02-20 NOTE — Patient Instructions (Signed)
Diabetes Mellitus and Nutrition, Adult When you have diabetes, or diabetes mellitus, it is very important to have healthy eating habits because your blood sugar (glucose) levels are greatly affected by what you eat and drink. Eating healthy foods in the right amounts, at about the same times every day, can help you:  Control your blood glucose.  Lower your risk of heart disease.  Improve your blood pressure.  Reach or maintain a healthy weight. What can affect my meal plan? Every person with diabetes is different, and each person has different needs for a meal plan. Your health care provider may recommend that you work with a dietitian to make a meal plan that is best for you. Your meal plan may vary depending on factors such as:  The calories you need.  The medicines you take.  Your weight.  Your blood glucose, blood pressure, and cholesterol levels.  Your activity level.  Other health conditions you have, such as heart or kidney disease. How do carbohydrates affect me? Carbohydrates, also called carbs, affect your blood glucose level more than any other type of food. Eating carbs naturally raises the amount of glucose in your blood. Carb counting is a method for keeping track of how many carbs you eat. Counting carbs is important to keep your blood glucose at a healthy level, especially if you use insulin or take certain oral diabetes medicines. It is important to know how many carbs you can safely have in each meal. This is different for every person. Your dietitian can help you calculate how many carbs you should have at each meal and for each snack. How does alcohol affect me? Alcohol can cause a sudden decrease in blood glucose (hypoglycemia), especially if you use insulin or take certain oral diabetes medicines. Hypoglycemia can be a life-threatening condition. Symptoms of hypoglycemia, such as sleepiness, dizziness, and confusion, are similar to symptoms of having too much  alcohol.  Do not drink alcohol if: ? Your health care provider tells you not to drink. ? You are pregnant, may be pregnant, or are planning to become pregnant.  If you drink alcohol: ? Do not drink on an empty stomach. ? Limit how much you use to:  0-1 drink a day for women.  0-2 drinks a day for men. ? Be aware of how much alcohol is in your drink. In the [place] one drink equals one 12 oz bottle of beer (355 mL), one 5 oz glass of wine (148 mL), or one 1 oz glass of hard liquor (44 mL). ? Keep yourself hydrated with water, diet soda, or unsweetened iced tea.  Keep in mind that regular soda, juice, and other mixers may contain a lot of sugar and must be counted as carbs. What are tips for following this plan? Reading food labels  Start by checking the serving size on the "Nutrition Facts" label of packaged foods and drinks. The amount of calories, carbs, fats, and other nutrients listed on the label is based on one serving of the item. Many items contain more than one serving per package.  Check the total grams (g) of carbs in one serving. You can calculate the number of servings of carbs in one serving by dividing the total carbs by 15. For example, if a food has 30 g of total carbs per serving, it would be equal to 2 servings of carbs.  Check the number of grams (g) of saturated fats and trans fats in one serving. Choose foods that have   a low amount or none of these fats.  Check the number of milligrams (mg) of salt (sodium) in one serving. Most people should limit total sodium intake to less than 2,300 mg per day.  Always check the nutrition information of foods labeled as "low-fat" or "nonfat." These foods may be higher in added sugar or refined carbs and should be avoided.  Talk to your dietitian to identify your daily goals for nutrients listed on the label. Shopping  Avoid buying canned, pre-made, or processed foods. These foods tend to be high in fat, sodium, and added  sugar.  Shop around the outside edge of the grocery store. This is where you will most often find fresh fruits and vegetables, bulk grains, fresh meats, and fresh dairy. Cooking  Use low-heat cooking methods, such as baking, instead of high-heat cooking methods like deep frying.  Cook using healthy oils, such as olive, canola, or sunflower oil.  Avoid cooking with butter, cream, or high-fat meats. Meal planning  Eat meals and snacks regularly, preferably at the same times every day. Avoid going long periods of time without eating.  Eat foods that are high in fiber, such as fresh fruits, vegetables, beans, and whole grains. Talk with your dietitian about how many servings of carbs you can eat at each meal.  Eat 4-6 oz (112-168 g) of lean protein each day, such as lean meat, chicken, fish, eggs, or tofu. One ounce (oz) of lean protein is equal to: ? 1 oz (28 g) of meat, chicken, or fish. ? 1 egg. ?  cup (62 g) of tofu.  Eat some foods each day that contain healthy fats, such as avocado, nuts, seeds, and fish.   What foods should I eat? Fruits Berries. Apples. Oranges. Peaches. Apricots. Plums. Grapes. Mango. Papaya. Pomegranate. Kiwi. Cherries. Vegetables Lettuce. Spinach. Leafy greens, including kale, chard, collard greens, and mustard greens. Beets. Cauliflower. Cabbage. Broccoli. Carrots. Green beans. Tomatoes. Peppers. Onions. Cucumbers. Brussels sprouts. Grains Whole grains, such as whole-wheat or whole-grain bread, crackers, tortillas, cereal, and pasta. Unsweetened oatmeal. Quinoa. Brown or wild rice. Meats and other proteins Seafood. Poultry without skin. Lean cuts of poultry and beef. Tofu. Nuts. Seeds. Dairy Low-fat or fat-free dairy products such as milk, yogurt, and cheese. The items listed above may not be a complete list of foods and beverages you can eat. Contact a dietitian for more information. What foods should I avoid? Fruits Fruits canned with  syrup. Vegetables Canned vegetables. Frozen vegetables with butter or cream sauce. Grains Refined white flour and flour products such as bread, pasta, snack foods, and cereals. Avoid all processed foods. Meats and other proteins Fatty cuts of meat. Poultry with skin. Breaded or fried meats. Processed meat. Avoid saturated fats. Dairy Full-fat yogurt, cheese, or milk. Beverages Sweetened drinks, such as soda or iced tea. The items listed above may not be a complete list of foods and beverages you should avoid. Contact a dietitian for more information. Questions to ask a health care provider  Do I need to meet with a diabetes educator?  Do I need to meet with a dietitian?  What number can I call if I have questions?  When are the best times to check my blood glucose? Where to find more information:  American Diabetes Association: diabetes.org  Academy of Nutrition and Dietetics: www.eatright.org  National Institute of Diabetes and Digestive and Kidney Diseases: www.niddk.nih.gov  Association of Diabetes Care and Education Specialists: www.diabeteseducator.org Summary  It is important to have healthy eating   habits because your blood sugar (glucose) levels are greatly affected by what you eat and drink.  A healthy meal plan will help you control your blood glucose and maintain a healthy lifestyle.  Your health care provider may recommend that you work with a dietitian to make a meal plan that is best for you.  Keep in mind that carbohydrates (carbs) and alcohol have immediate effects on your blood glucose levels. It is important to count carbs and to use alcohol carefully. This information is not intended to replace advice given to you by your health care provider. Make sure you discuss any questions you have with your health care provider. Document Revised: 07/07/2019 Document Reviewed: 07/07/2019 Elsevier Patient Education  2021 Elsevier Inc.  

## 2021-02-21 LAB — COMP. METABOLIC PANEL (12)
AST: 32 IU/L (ref 0–40)
Albumin/Globulin Ratio: 1.7 (ref 1.2–2.2)
Albumin: 4.7 g/dL (ref 4.0–5.0)
Alkaline Phosphatase: 122 IU/L — ABNORMAL HIGH (ref 44–121)
BUN/Creatinine Ratio: 13 (ref 9–20)
BUN: 12 mg/dL (ref 6–24)
Bilirubin Total: 0.6 mg/dL (ref 0.0–1.2)
Calcium: 9.8 mg/dL (ref 8.7–10.2)
Chloride: 94 mmol/L — ABNORMAL LOW (ref 96–106)
Creatinine, Ser: 0.91 mg/dL (ref 0.76–1.27)
Globulin, Total: 2.8 g/dL (ref 1.5–4.5)
Glucose: 256 mg/dL — ABNORMAL HIGH (ref 65–99)
Potassium: 4.1 mmol/L (ref 3.5–5.2)
Sodium: 136 mmol/L (ref 134–144)
Total Protein: 7.5 g/dL (ref 6.0–8.5)
eGFR: 106 mL/min/{1.73_m2} (ref >59)

## 2021-02-21 LAB — LIPID PANEL
Chol/HDL Ratio: 5.2 ratio — ABNORMAL HIGH (ref 0.0–5.0)
Cholesterol, Total: 141 mg/dL (ref 100–199)
HDL: 27 mg/dL — ABNORMAL LOW (ref >39)
LDL Chol Calc (NIH): 88 mg/dL (ref 0–99)
Triglycerides: 146 mg/dL (ref 0–149)
VLDL Cholesterol Cal: 26 mg/dL (ref 5–40)

## 2021-02-21 LAB — HIV ANTIBODY (ROUTINE TESTING W REFLEX): HIV Screen 4th Generation wRfx: NONREACTIVE

## 2021-02-21 LAB — MICROALBUMIN, URINE: Microalbumin, Urine: 41.8 ug/mL

## 2021-02-21 LAB — HEPATITIS C ANTIBODY: Hep C Virus Ab: 0.1 s/co ratio (ref 0.0–0.9)

## 2021-02-22 LAB — URINE CULTURE

## 2021-02-23 ENCOUNTER — Encounter: Payer: Self-pay | Admitting: Nurse Practitioner

## 2021-02-24 ENCOUNTER — Telehealth: Payer: Self-pay

## 2021-02-24 NOTE — Telephone Encounter (Signed)
-----   Message from Barbette Merino, NP sent at 02/23/2021  4:26 PM EDT ----- Overall labs are stable No additional changes needed to treatment. And please verify that he is tolerant to metformin Thanks

## 2021-02-24 NOTE — Telephone Encounter (Signed)
Left voice mail to call back 

## 2021-04-05 ENCOUNTER — Ambulatory Visit: Payer: BLUE CROSS/BLUE SHIELD | Admitting: Nurse Practitioner

## 2021-04-19 ENCOUNTER — Other Ambulatory Visit: Payer: Self-pay

## 2021-04-19 DIAGNOSIS — J4520 Mild intermittent asthma, uncomplicated: Secondary | ICD-10-CM

## 2021-04-19 MED ORDER — BUDESONIDE-FORMOTEROL FUMARATE 80-4.5 MCG/ACT IN AERO: 2.0000 | INHALATION_SPRAY | Freq: Two times a day (BID) | RESPIRATORY_TRACT | 6 refills | Status: DC

## 2021-12-04 ENCOUNTER — Ambulatory Visit (INDEPENDENT_AMBULATORY_CARE_PROVIDER_SITE_OTHER): Payer: BLUE CROSS/BLUE SHIELD | Admitting: Nurse Practitioner

## 2021-12-04 ENCOUNTER — Encounter: Payer: Self-pay | Admitting: Nurse Practitioner

## 2021-12-04 VITALS — BP 138/96 | HR 96 | Temp 99.0°F | Ht 69.0 in | Wt 267.2 lb

## 2021-12-04 DIAGNOSIS — J3089 Other allergic rhinitis: Secondary | ICD-10-CM

## 2021-12-04 DIAGNOSIS — J4520 Mild intermittent asthma, uncomplicated: Secondary | ICD-10-CM | POA: Diagnosis not present

## 2021-12-04 DIAGNOSIS — E785 Hyperlipidemia, unspecified: Secondary | ICD-10-CM

## 2021-12-04 DIAGNOSIS — E1165 Type 2 diabetes mellitus with hyperglycemia: Secondary | ICD-10-CM

## 2021-12-04 LAB — POCT URINALYSIS DIP (CLINITEK)
Bilirubin, UA: NEGATIVE
Glucose, UA: 500 mg/dL — AB
Leukocytes, UA: NEGATIVE
Nitrite, UA: NEGATIVE
POC PROTEIN,UA: NEGATIVE
Spec Grav, UA: 1.02 (ref 1.010–1.025)
Urobilinogen, UA: 0.2 E.U./dL
pH, UA: 5 (ref 5.0–8.0)

## 2021-12-04 LAB — POCT GLYCOSYLATED HEMOGLOBIN (HGB A1C)
HbA1c POC (<> result, manual entry): 13.1 % (ref 4.0–5.6)
HbA1c, POC (controlled diabetic range): 13.1 % — AB (ref 0.0–7.0)
HbA1c, POC (prediabetic range): 13.1 % — AB (ref 5.7–6.4)
Hemoglobin A1C: 13.1 % — AB (ref 4.0–5.6)

## 2021-12-04 MED ORDER — CETIRIZINE HCL 10 MG PO TABS: ORAL_TABLET | ORAL | 11 refills | Status: AC

## 2021-12-04 MED ORDER — BUDESONIDE-FORMOTEROL FUMARATE 80-4.5 MCG/ACT IN AERO: 2.0000 | INHALATION_SPRAY | Freq: Two times a day (BID) | RESPIRATORY_TRACT | 6 refills | Status: DC

## 2021-12-04 MED ORDER — METFORMIN HCL 500 MG PO TABS: ORAL_TABLET | ORAL | 3 refills | Status: DC

## 2021-12-04 NOTE — Assessment & Plan Note (Signed)
?  Lab Results  ?Component Value Date  ? HGBA1C 13.1 (A) 12/04/2021  ? HGBA1C 13.1 12/04/2021  ? HGBA1C 13.1 (A) 12/04/2021  ? HGBA1C 13.1 (A) 12/04/2021  ? ? ? ?- HgB A1c ?- CBC ?- Comprehensive metabolic panel ?- Lipid Panel ?- Urinalysis ?- Ambulatory referral to diabetic education ?- metFORMIN (GLUCOPHAGE) 500 MG tablet; Take 1 tablet (500 mg total) by mouth 2 (two) times daily with a meal for 7 days, THEN 2 tablets (1,000 mg total) 2 (two) times daily with a meal.  Dispense: 180 tablet; Refill: 3 ? ?2. Mild intermittent asthma, unspecified whether complicated ? ?- budesonide-formoterol (SYMBICORT) 80-4.5 MCG/ACT inhaler; Inhale 2 puffs into the lungs 2 (two) times daily.  Dispense: 10.2 g; Refill: 6 ?- Urinalysis ? ?3. Environmental and seasonal allergies ? ?- cetirizine (ZYRTEC) 10 MG tablet; TAKE 1 TABLET(10 MG) BY MOUTH DAILY  Dispense: 30 tablet; Refill: 11 ?- Urinalysis ? ?4. Hyperlipidemia, unspecified hyperlipidemia type ? ?- CBC ?- Comprehensive metabolic panel ?- Lipid Panel ?- Urinalysis ? ?Follow up: ? ?Follow up in 3 months or sooner if needed ?

## 2021-12-04 NOTE — Patient Instructions (Addendum)
1. Type 2 diabetes mellitus with hyperglycemia, without long-term current use of insulin (HCC) ? ?Lab Results  ?Component Value Date  ? HGBA1C 13.1 (A) 12/04/2021  ? HGBA1C 13.1 12/04/2021  ? HGBA1C 13.1 (A) 12/04/2021  ? HGBA1C 13.1 (A) 12/04/2021  ? ? ? ?- HgB A1c ?- CBC ?- Comprehensive metabolic panel ?- Lipid Panel ?- Urinalysis ?- Ambulatory referral to diabetic education ?- metFORMIN (GLUCOPHAGE) 500 MG tablet; Take 1 tablet (500 mg total) by mouth 2 (two) times daily with a meal for 7 days, THEN 2 tablets (1,000 mg total) 2 (two) times daily with a meal.  Dispense: 180 tablet; Refill: 3 ? ?2. Mild intermittent asthma, unspecified whether complicated ? ?- budesonide-formoterol (SYMBICORT) 80-4.5 MCG/ACT inhaler; Inhale 2 puffs into the lungs 2 (two) times daily.  Dispense: 10.2 g; Refill: 6 ?- Urinalysis ? ?3. Environmental and seasonal allergies ? ?- cetirizine (ZYRTEC) 10 MG tablet; TAKE 1 TABLET(10 MG) BY MOUTH DAILY  Dispense: 30 tablet; Refill: 11 ?- Urinalysis ? ?4. Hyperlipidemia, unspecified hyperlipidemia type ? ?- CBC ?- Comprehensive metabolic panel ?- Lipid Panel ?- Urinalysis ? ?Follow up: ? ?Follow up in 3 months or sooner if needed ? ?

## 2021-12-04 NOTE — Progress Notes (Signed)
$'@Patient'x$  ID: Cole Morales, male    DOB: 10/24/1975, 46 y.o.   MRN: [id number]  Chief Complaint  Patient presents with   Diabetes    Patient is here today for his follow up and medication refills.     Referring provider: Vevelyn Francois, NP  [facility] presents for follow up. A former patient of [employer].  He  has a past medical history of Anxiety (01/2020), Asthma, GERD (gastroesophageal reflux disease), Grief (01/2020), Hyperlipidemia, mixed (07/2019), Obesity, and Vitamin D deficiency (07/2019).    HPI  Diabetes Mellitus   Patient presents today for diabetes follow-up.  He saw Dionisio David last on 02/20/2021.  He was given a 1 month supply of medications and was told to follow up.  Patient failed to follow-up at that time.  He ran out of his medications and has not gotten any refills.  Patient states that he has been checking blood sugars at home and admits that they have been high.  He admits that he has not been following a diabetic diet.  He states that he does eat a lot of ice cream.  Hemoglobin A1c is elevated today at 13.1.  We discussed that we will refer him to diabetic educator.  We will refill his diabetic medications.  Patient states that he does need asthma medications refilled as well today.  Patient is due for blood work today including follow-up lipid panel for hyper lipidemia. Denies f/c/s, n/v/d, hemoptysis, PND, chest pain or edema.   No Known Allergies   There is no immunization history on file for this patient.  Past Medical History:  Diagnosis Date   Anxiety 01/2020   Asthma    GERD (gastroesophageal reflux disease)    Grief 01/2020   Hyperlipidemia, mixed 07/2019   Obesity    Vitamin D deficiency 07/2019    Tobacco History: Social History   Tobacco Use  Smoking Status Former   Types: Cigarettes   Quit date: 01/19/2018   Years since quitting: 3.8  Smokeless Tobacco Never   Counseling given: Not Answered   Outpatient Encounter Medications  as of 12/04/2021  Medication Sig   albuterol (PROVENTIL) (2.5 MG/3ML) 0.083% nebulizer solution Take 3 mLs (2.5 mg total) by nebulization every 6 (six) hours as needed for wheezing or shortness of breath.   albuterol (VENTOLIN HFA) 108 (90 Base) MCG/ACT inhaler Inhale 1-2 puffs into the lungs every 6 (six) hours as needed for wheezing or shortness of breath.   atorvastatin (LIPITOR) 20 MG tablet Take 1 tablet (20 mg total) by mouth daily.   blood glucose meter kit and supplies KIT Dispense based on patient and insurance preference. Use up to four times daily as directed.   fluticasone (FLONASE) 50 MCG/ACT nasal spray Place 2 sprays into both nostrils daily.   ibuprofen (ADVIL) 800 MG tablet TAKE 1 TABLET(800 MG) BY MOUTH EVERY 8 HOURS AS NEEDED   omeprazole (PRILOSEC) 40 MG capsule TAKE 1 CAPSULE(40 MG) BY MOUTH TWICE DAILY   [DISCONTINUED] budesonide-formoterol (SYMBICORT) 80-4.5 MCG/ACT inhaler Inhale 2 puffs into the lungs 2 (two) times daily.   [DISCONTINUED] cetirizine (ZYRTEC) 10 MG tablet TAKE 1 TABLET(10 MG) BY MOUTH DAILY   [DISCONTINUED] metFORMIN (GLUCOPHAGE) 500 MG tablet Take 1 tablet (500 mg total) by mouth 2 (two) times daily with a meal for 7 days, THEN 2 tablets (1,000 mg total) 2 (two) times daily with a meal.   budesonide-formoterol (SYMBICORT) 80-4.5 MCG/ACT inhaler Inhale 2 puffs into the lungs 2 (two) times daily.  cetirizine (ZYRTEC) 10 MG tablet TAKE 1 TABLET(10 MG) BY MOUTH DAILY   guaiFENesin (MUCINEX) 600 MG 12 hr tablet Take 1 tablet (600 mg total) by mouth 2 (two) times daily. (Patient not taking: Reported on 12/04/2021)   metFORMIN (GLUCOPHAGE) 500 MG tablet Take 1 tablet (500 mg total) by mouth 2 (two) times daily with a meal for 7 days, THEN 2 tablets (1,000 mg total) 2 (two) times daily with a meal.   No facility-administered encounter medications on file as of 12/04/2021.     Review of Systems  Review of Systems  Constitutional: Negative.   HENT: Negative.     Cardiovascular: Negative.   Gastrointestinal: Negative.   Allergic/Immunologic: Negative.   Neurological: Negative.   Psychiatric/Behavioral: Negative.        Physical Exam  BP (!) 138/96   Pulse 96   Temp 99 F (37.2 C)   Ht $R'5\' 9"'Ft$  (1.753 m)   Wt 267 lb 3.2 oz (121.2 kg)   SpO2 99%   BMI 39.46 kg/m   Wt Readings from Last 5 Encounters:  12/04/21 267 lb 3.2 oz (121.2 kg)  02/20/21 275 lb 0.6 oz (124.8 kg)  08/19/20 278 lb (126.1 kg)  01/29/20 296 lb 0.2 oz (134.3 kg)  07/31/19 291 lb 6.4 oz (132.2 kg)     Physical Exam Vitals and nursing note reviewed.  Constitutional:      General: He is not in acute distress.    Appearance: He is well-developed.  Cardiovascular:     Rate and Rhythm: Normal rate and regular rhythm.  Pulmonary:     Effort: Pulmonary effort is normal.     Breath sounds: Normal breath sounds.  Skin:    General: Skin is warm and dry.  Neurological:     Mental Status: He is alert and oriented to person, place, and time.     Lab Results:  CBC    Component Value Date/Time   WBC 7.1 08/19/2020 1416   WBC 6.5 08/16/2018 0158   RBC 5.68 08/19/2020 1416   RBC 5.75 08/16/2018 0158   HGB 15.5 08/19/2020 1416   HCT 46.4 08/19/2020 1416   PLT 245 08/19/2020 1416   MCV 82 08/19/2020 1416   MCH 27.3 08/19/2020 1416   MCH 28.2 08/16/2018 0158   MCHC 33.4 08/19/2020 1416   MCHC 33.8 08/16/2018 0158   RDW 12.5 08/19/2020 1416   LYMPHSABS 2.0 08/19/2020 1416   EOSABS 0.1 08/19/2020 1416   BASOSABS 0.1 08/19/2020 1416    BMET    Component Value Date/Time   NA 136 02/20/2021 1412   K 4.1 02/20/2021 1412   CL 94 (L) 02/20/2021 1412   CO2 27 08/19/2020 1416   GLUCOSE 256 (H) 02/20/2021 1412   GLUCOSE 111 (H) 08/16/2018 0158   BUN 12 02/20/2021 1412   CREATININE 0.91 02/20/2021 1412   CALCIUM 9.8 02/20/2021 1412   GFRNONAA 85 08/19/2020 1416   GFRAA 99 08/19/2020 1416    BNP No results found for: BNP  ProBNP No results found for:  PROBNP  Imaging: No results found.   Assessment & Plan:   Type 2 diabetes mellitus with hyperglycemia, without long-term current use of insulin (HCC)  Lab Results  Component Value Date   HGBA1C 13.1 (A) 12/04/2021   HGBA1C 13.1 12/04/2021   HGBA1C 13.1 (A) 12/04/2021   HGBA1C 13.1 (A) 12/04/2021     - HgB A1c - CBC - Comprehensive metabolic panel - Lipid Panel - Urinalysis - Ambulatory referral  to diabetic education - metFORMIN (GLUCOPHAGE) 500 MG tablet; Take 1 tablet (500 mg total) by mouth 2 (two) times daily with a meal for 7 days, THEN 2 tablets (1,000 mg total) 2 (two) times daily with a meal.  Dispense: 180 tablet; Refill: 3  2. Mild intermittent asthma, unspecified whether complicated  - budesonide-formoterol (SYMBICORT) 80-4.5 MCG/ACT inhaler; Inhale 2 puffs into the lungs 2 (two) times daily.  Dispense: 10.2 g; Refill: 6 - Urinalysis  3. Environmental and seasonal allergies  - cetirizine (ZYRTEC) 10 MG tablet; TAKE 1 TABLET(10 MG) BY MOUTH DAILY  Dispense: 30 tablet; Refill: 11 - Urinalysis  4. Hyperlipidemia, unspecified hyperlipidemia type  - CBC - Comprehensive metabolic panel - Lipid Panel - Urinalysis  Follow up:  Follow up in 3 months or sooner if needed     Fenton Foy, NP 12/04/2021

## 2021-12-05 ENCOUNTER — Other Ambulatory Visit: Payer: Self-pay | Admitting: Family Medicine

## 2021-12-05 DIAGNOSIS — J4520 Mild intermittent asthma, uncomplicated: Secondary | ICD-10-CM

## 2021-12-05 LAB — CBC
Hematocrit: 49.3 % (ref 37.5–51.0)
Hemoglobin: 16.1 g/dL (ref 13.0–17.7)
MCH: 27.1 pg (ref 26.6–33.0)
MCHC: 32.7 g/dL (ref 31.5–35.7)
MCV: 83 fL (ref 79–97)
Platelets: 217 10*3/uL (ref 150–450)
RBC: 5.95 x10E6/uL — ABNORMAL HIGH (ref 4.14–5.80)
RDW: 12.9 % (ref 11.6–15.4)
WBC: 7.1 10*3/uL (ref 3.4–10.8)

## 2021-12-05 LAB — URINALYSIS
Bilirubin, UA: NEGATIVE
Leukocytes,UA: NEGATIVE
Nitrite, UA: NEGATIVE
Protein,UA: NEGATIVE
RBC, UA: NEGATIVE
Specific Gravity, UA: >=1.03 — AB (ref 1.005–1.030)
Urobilinogen, Ur: 0.2 mg/dL (ref 0.2–1.0)
pH, UA: 5.5 (ref 5.0–7.5)

## 2021-12-05 LAB — COMPREHENSIVE METABOLIC PANEL
ALT: 44 IU/L (ref 0–44)
AST: 23 IU/L (ref 0–40)
Albumin/Globulin Ratio: 1.6 (ref 1.2–2.2)
Albumin: 4.7 g/dL (ref 4.0–5.0)
Alkaline Phosphatase: 142 IU/L — ABNORMAL HIGH (ref 44–121)
BUN/Creatinine Ratio: 12 (ref 9–20)
BUN: 11 mg/dL (ref 6–24)
Bilirubin Total: 0.5 mg/dL (ref 0.0–1.2)
CO2: 25 mmol/L (ref 20–29)
Calcium: 9.9 mg/dL (ref 8.7–10.2)
Chloride: 94 mmol/L — ABNORMAL LOW (ref 96–106)
Creatinine, Ser: 0.9 mg/dL (ref 0.76–1.27)
Globulin, Total: 2.9 g/dL (ref 1.5–4.5)
Glucose: 437 mg/dL — ABNORMAL HIGH (ref 70–99)
Potassium: 4.2 mmol/L (ref 3.5–5.2)
Sodium: 137 mmol/L (ref 134–144)
Total Protein: 7.6 g/dL (ref 6.0–8.5)
eGFR: 107 mL/min/{1.73_m2} (ref >59)

## 2021-12-05 LAB — LIPID PANEL
Chol/HDL Ratio: 5.9 ratio — ABNORMAL HIGH (ref 0.0–5.0)
Cholesterol, Total: 164 mg/dL (ref 100–199)
HDL: 28 mg/dL — ABNORMAL LOW (ref >39)
LDL Chol Calc (NIH): 70 mg/dL (ref 0–99)
Triglycerides: 419 mg/dL — ABNORMAL HIGH (ref 0–149)
VLDL Cholesterol Cal: 66 mg/dL — ABNORMAL HIGH (ref 5–40)

## 2021-12-05 MED ORDER — BUDESONIDE-FORMOTEROL FUMARATE 80-4.5 MCG/ACT IN AERO: 2.0000 | INHALATION_SPRAY | Freq: Two times a day (BID) | RESPIRATORY_TRACT | 12 refills | Status: DC

## 2021-12-05 NOTE — Progress Notes (Signed)
Meds ordered this encounter  ?Medications  ? budesonide-formoterol (SYMBICORT) 80-4.5 MCG/ACT inhaler  ?  Sig: Inhale 2 puffs into the lungs 2 (two) times daily.  ?  Dispense:  1 each  ?  Refill:  12  ?  Order Specific Question:   Supervising Provider  ?  AnswerQuentin Angst [[id number]]  ?  ?

## 2022-01-03 ENCOUNTER — Encounter: Payer: Self-pay | Admitting: Nurse Practitioner

## 2022-01-03 ENCOUNTER — Ambulatory Visit (INDEPENDENT_AMBULATORY_CARE_PROVIDER_SITE_OTHER): Payer: BLUE CROSS/BLUE SHIELD | Admitting: Nurse Practitioner

## 2022-01-03 VITALS — BP 134/89 | HR 73 | Temp 97.9°F | Ht 69.0 in | Wt 268.8 lb

## 2022-01-03 DIAGNOSIS — F419 Anxiety disorder, unspecified: Secondary | ICD-10-CM

## 2022-01-03 DIAGNOSIS — J45909 Unspecified asthma, uncomplicated: Secondary | ICD-10-CM | POA: Diagnosis not present

## 2022-01-03 DIAGNOSIS — E1165 Type 2 diabetes mellitus with hyperglycemia: Secondary | ICD-10-CM

## 2022-01-03 MED ORDER — HYDROXYZINE HCL 10 MG PO TABS: 10.0000 mg | ORAL_TABLET | Freq: Three times a day (TID) | ORAL | 0 refills | Status: DC | PRN

## 2022-01-03 MED ORDER — ALBUTEROL SULFATE (2.5 MG/3ML) 0.083% IN NEBU: 2.5000 mg | INHALATION_SOLUTION | Freq: Four times a day (QID) | RESPIRATORY_TRACT | 12 refills | Status: AC | PRN

## 2022-01-03 MED ORDER — ALBUTEROL SULFATE HFA 108 (90 BASE) MCG/ACT IN AERS: 1.0000 | INHALATION_SPRAY | Freq: Four times a day (QID) | RESPIRATORY_TRACT | 0 refills | Status: DC | PRN

## 2022-01-03 NOTE — Progress Notes (Unsigned)
$'@Patient'f$  ID: Cole Morales, male    DOB: 13-Feb-1976, 46 y.o.   MRN: [id number]  Chief Complaint  Patient presents with   Follow-up    Pt is here for DM follow up visit. Pt is requesting a refill on albuterol (VENTOLIN HFA)    Referring provider: Fenton Foy, NP   HPI  46 year old male with history of asthma, sleep apnea, GERD, diabetes, vitamin D deficiency, hyperlipidemia, obesity.  Patient presents today for follow-up on diabetes.  Overall patient has been doing well and is compliant with his medications.  He states that he has been trying to do better with his diabetic diet.  He is trying to stay active.  Patient states that he does need a refill on his inhaler today.  He also states that he has been having some anxiety especially at night when he tries to go to sleep because his mind is racing.  Patient would like something to take for anxiety.  We discussed that we can trial Vistaril for this.  Patient was also offered counseling but declines at this time. Denies f/c/s, n/v/d, hemoptysis, PND, chest pain or edema.         No Known Allergies   There is no immunization history on file for this patient.  Past Medical History:  Diagnosis Date   Anxiety 01/2020   Asthma    GERD (gastroesophageal reflux disease)    Grief 01/2020   Hyperlipidemia, mixed 07/2019   Obesity    Vitamin D deficiency 07/2019    Tobacco History: Social History   Tobacco Use  Smoking Status Former   Types: Cigarettes   Quit date: 01/19/2018   Years since quitting: 3.9  Smokeless Tobacco Never   Counseling given: Not Answered   Outpatient Encounter Medications as of 01/03/2022  Medication Sig   atorvastatin (LIPITOR) 20 MG tablet Take 1 tablet (20 mg total) by mouth daily.   blood glucose meter kit and supplies KIT Dispense based on patient and insurance preference. Use up to four times daily as directed.   budesonide-formoterol (SYMBICORT) 80-4.5 MCG/ACT inhaler Inhale 2 puffs into  the lungs 2 (two) times daily.   cetirizine (ZYRTEC) 10 MG tablet TAKE 1 TABLET(10 MG) BY MOUTH DAILY   fluticasone (FLONASE) 50 MCG/ACT nasal spray Place 2 sprays into both nostrils daily.   hydrOXYzine (ATARAX) 10 MG tablet Take 1 tablet (10 mg total) by mouth 3 (three) times daily as needed.   ibuprofen (ADVIL) 800 MG tablet TAKE 1 TABLET(800 MG) BY MOUTH EVERY 8 HOURS AS NEEDED   metFORMIN (GLUCOPHAGE) 500 MG tablet Take 1 tablet (500 mg total) by mouth 2 (two) times daily with a meal for 7 days, THEN 2 tablets (1,000 mg total) 2 (two) times daily with a meal.   omeprazole (PRILOSEC) 40 MG capsule TAKE 1 CAPSULE(40 MG) BY MOUTH TWICE DAILY   [DISCONTINUED] albuterol (PROVENTIL) (2.5 MG/3ML) 0.083% nebulizer solution Take 3 mLs (2.5 mg total) by nebulization every 6 (six) hours as needed for wheezing or shortness of breath.   [DISCONTINUED] albuterol (VENTOLIN HFA) 108 (90 Base) MCG/ACT inhaler Inhale 1-2 puffs into the lungs every 6 (six) hours as needed for wheezing or shortness of breath.   albuterol (PROVENTIL) (2.5 MG/3ML) 0.083% nebulizer solution Take 3 mLs (2.5 mg total) by nebulization every 6 (six) hours as needed for wheezing or shortness of breath.   albuterol (VENTOLIN HFA) 108 (90 Base) MCG/ACT inhaler Inhale 1-2 puffs into the lungs every 6 (six) hours as needed for  wheezing or shortness of breath.   guaiFENesin (MUCINEX) 600 MG 12 hr tablet Take 1 tablet (600 mg total) by mouth 2 (two) times daily. (Patient not taking: Reported on 01/03/2022)   No facility-administered encounter medications on file as of 01/03/2022.     Review of Systems  Review of Systems  Constitutional: Negative.   HENT: Negative.    Cardiovascular: Negative.   Gastrointestinal: Negative.   Allergic/Immunologic: Negative.   Neurological: Negative.   Psychiatric/Behavioral: Negative.        Physical Exam  BP 134/89 (BP Location: Left Arm, Patient Position: Sitting, Cuff Size: Large)   Pulse 73    Temp 97.9 F (36.6 C)   Ht $R'5\' 9"'TC$  (1.753 m)   Wt 268 lb 12.8 oz (121.9 kg)   SpO2 100%   BMI 39.69 kg/m   Wt Readings from Last 5 Encounters:  01/03/22 268 lb 12.8 oz (121.9 kg)  12/04/21 267 lb 3.2 oz (121.2 kg)  02/20/21 275 lb 0.6 oz (124.8 kg)  08/19/20 278 lb (126.1 kg)  01/29/20 296 lb 0.2 oz (134.3 kg)     Physical Exam Vitals and nursing note reviewed.  Constitutional:      General: He is not in acute distress.    Appearance: He is well-developed.  Cardiovascular:     Rate and Rhythm: Normal rate and regular rhythm.  Pulmonary:     Effort: Pulmonary effort is normal.     Breath sounds: Normal breath sounds.  Skin:    General: Skin is warm and dry.  Neurological:     Mental Status: He is alert and oriented to person, place, and time.     Lab Results:  CBC    Component Value Date/Time   WBC 7.1 01/03/2022 1500   WBC 6.5 08/16/2018 0158   RBC 5.83 (H) 01/03/2022 1500   RBC 5.75 08/16/2018 0158   HGB 15.7 01/03/2022 1500   HCT 46.8 01/03/2022 1500   PLT 220 01/03/2022 1500   MCV 80 01/03/2022 1500   MCH 26.9 01/03/2022 1500   MCH 28.2 08/16/2018 0158   MCHC 33.5 01/03/2022 1500   MCHC 33.8 08/16/2018 0158   RDW 13.0 01/03/2022 1500   LYMPHSABS 2.0 08/19/2020 1416   EOSABS 0.1 08/19/2020 1416   BASOSABS 0.1 08/19/2020 1416    BMET    Component Value Date/Time   NA 139 01/03/2022 1500   K 4.3 01/03/2022 1500   CL 96 01/03/2022 1500   CO2 25 01/03/2022 1500   GLUCOSE 233 (H) 01/03/2022 1500   GLUCOSE 111 (H) 08/16/2018 0158   BUN 9 01/03/2022 1500   CREATININE 0.76 01/03/2022 1500   CALCIUM 9.9 01/03/2022 1500   GFRNONAA 85 08/19/2020 1416   GFRAA 99 08/19/2020 1416    BNP No results found for: BNP  ProBNP No results found for: PROBNP  Imaging: No results found.   Assessment & Plan:   Type 2 diabetes mellitus with hyperglycemia, without long-term current use of insulin (HCC) - POCT glycosylated hemoglobin (Hb A1C) - albuterol  (PROVENTIL) (2.5 MG/3ML) 0.083% nebulizer solution; Take 3 mLs (2.5 mg total) by nebulization every 6 (six) hours as needed for wheezing or shortness of breath.  Dispense: 75 mL; Refill: 12    2. Moderate asthma without complication, unspecified whether persistent  - albuterol (VENTOLIN HFA) 108 (90 Base) MCG/ACT inhaler; Inhale 1-2 puffs into the lungs every 6 (six) hours as needed for wheezing or shortness of breath.  Dispense: 1 each; Refill: 0  3. Anxiety  - hydrOXYzine (ATARAX) 10 MG tablet; Take 1 tablet (10 mg total) by mouth 3 (three) times daily as needed.  Dispense: 30 tablet; Refill: 0    Follow up:  Follow up in 3 months or sooner if needed     Fenton Foy, NP 01/04/2022

## 2022-01-03 NOTE — Patient Instructions (Addendum)
1. Type 2 diabetes mellitus with hyperglycemia, without long-term current use of insulin (HCC)  - POCT glycosylated hemoglobin (Hb A1C) - albuterol (PROVENTIL) (2.5 MG/3ML) 0.083% nebulizer solution; Take 3 mLs (2.5 mg total) by nebulization every 6 (six) hours as needed for wheezing or shortness of breath.  Dispense: 75 mL; Refill: 12    2. Moderate asthma without complication, unspecified whether persistent  - albuterol (VENTOLIN HFA) 108 (90 Base) MCG/ACT inhaler; Inhale 1-2 puffs into the lungs every 6 (six) hours as needed for wheezing or shortness of breath.  Dispense: 1 each; Refill: 0   3. Anxiety  - hydrOXYzine (ATARAX) 10 MG tablet; Take 1 tablet (10 mg total) by mouth 3 (three) times daily as needed.  Dispense: 30 tablet; Refill: 0    Follow up:  Follow up in 3 months or sooner if needed

## 2022-01-04 ENCOUNTER — Encounter: Payer: Self-pay | Admitting: Nurse Practitioner

## 2022-01-04 LAB — COMPREHENSIVE METABOLIC PANEL
ALT: 45 IU/L — ABNORMAL HIGH (ref 0–44)
AST: 34 IU/L (ref 0–40)
Albumin/Globulin Ratio: 1.7 (ref 1.2–2.2)
Albumin: 4.6 g/dL (ref 4.0–5.0)
Alkaline Phosphatase: 114 IU/L (ref 44–121)
BUN/Creatinine Ratio: 12 (ref 9–20)
BUN: 9 mg/dL (ref 6–24)
Bilirubin Total: 0.5 mg/dL (ref 0.0–1.2)
CO2: 25 mmol/L (ref 20–29)
Calcium: 9.9 mg/dL (ref 8.7–10.2)
Chloride: 96 mmol/L (ref 96–106)
Creatinine, Ser: 0.76 mg/dL (ref 0.76–1.27)
Globulin, Total: 2.7 g/dL (ref 1.5–4.5)
Glucose: 233 mg/dL — ABNORMAL HIGH (ref 70–99)
Potassium: 4.3 mmol/L (ref 3.5–5.2)
Sodium: 139 mmol/L (ref 134–144)
Total Protein: 7.3 g/dL (ref 6.0–8.5)
eGFR: 112 mL/min/{1.73_m2} (ref >59)

## 2022-01-04 LAB — CBC
Hematocrit: 46.8 % (ref 37.5–51.0)
Hemoglobin: 15.7 g/dL (ref 13.0–17.7)
MCH: 26.9 pg (ref 26.6–33.0)
MCHC: 33.5 g/dL (ref 31.5–35.7)
MCV: 80 fL (ref 79–97)
Platelets: 220 10*3/uL (ref 150–450)
RBC: 5.83 x10E6/uL — ABNORMAL HIGH (ref 4.14–5.80)
RDW: 13 % (ref 11.6–15.4)
WBC: 7.1 10*3/uL (ref 3.4–10.8)

## 2022-01-04 NOTE — Assessment & Plan Note (Signed)
-   POCT glycosylated hemoglobin (Hb A1C) - albuterol (PROVENTIL) (2.5 MG/3ML) 0.083% nebulizer solution; Take 3 mLs (2.5 mg total) by nebulization every 6 (six) hours as needed for wheezing or shortness of breath.  Dispense: 75 mL; Refill: 12    2. Moderate asthma without complication, unspecified whether persistent  - albuterol (VENTOLIN HFA) 108 (90 Base) MCG/ACT inhaler; Inhale 1-2 puffs into the lungs every 6 (six) hours as needed for wheezing or shortness of breath.  Dispense: 1 each; Refill: 0   3. Anxiety  - hydrOXYzine (ATARAX) 10 MG tablet; Take 1 tablet (10 mg total) by mouth 3 (three) times daily as needed.  Dispense: 30 tablet; Refill: 0    Follow up:  Follow up in 3 months or sooner if needed

## 2022-03-05 ENCOUNTER — Encounter: Payer: Self-pay | Admitting: Nurse Practitioner

## 2022-03-05 ENCOUNTER — Ambulatory Visit (INDEPENDENT_AMBULATORY_CARE_PROVIDER_SITE_OTHER): Payer: BLUE CROSS/BLUE SHIELD | Admitting: Nurse Practitioner

## 2022-03-05 VITALS — BP 137/92 | HR 86 | Temp 97.6°F | Resp 18 | Ht 69.0 in | Wt 266.0 lb

## 2022-03-05 DIAGNOSIS — G4700 Insomnia, unspecified: Secondary | ICD-10-CM | POA: Diagnosis not present

## 2022-03-05 DIAGNOSIS — E785 Hyperlipidemia, unspecified: Secondary | ICD-10-CM | POA: Diagnosis not present

## 2022-03-05 DIAGNOSIS — K029 Dental caries, unspecified: Secondary | ICD-10-CM

## 2022-03-05 DIAGNOSIS — E1165 Type 2 diabetes mellitus with hyperglycemia: Secondary | ICD-10-CM

## 2022-03-05 DIAGNOSIS — E782 Mixed hyperlipidemia: Secondary | ICD-10-CM | POA: Diagnosis not present

## 2022-03-05 LAB — POCT GLYCOSYLATED HEMOGLOBIN (HGB A1C)
HbA1c POC (<> result, manual entry): 11.8 % (ref 4.0–5.6)
HbA1c, POC (controlled diabetic range): 11.8 % — AB (ref 0.0–7.0)
HbA1c, POC (prediabetic range): 11.8 % — AB (ref 5.7–6.4)
Hemoglobin A1C: 11.8 % — AB (ref 4.0–5.6)

## 2022-03-05 MED ORDER — TRAZODONE HCL 50 MG PO TABS: 25.0000 mg | ORAL_TABLET | Freq: Every evening | ORAL | 0 refills | Status: DC | PRN

## 2022-03-05 MED ORDER — ATORVASTATIN CALCIUM 20 MG PO TABS: 20.0000 mg | ORAL_TABLET | Freq: Every day | ORAL | 3 refills | Status: DC

## 2022-03-05 MED ORDER — AMOXICILLIN-POT CLAVULANATE 875-125 MG PO TABS
1.0000 | ORAL_TABLET | Freq: Two times a day (BID) | ORAL | 0 refills | Status: DC
Start: 2022-03-05 — End: 2022-07-03

## 2022-03-05 NOTE — Progress Notes (Unsigned)
_0  ID: Cole Morales, male    DOB: 01/28/1976, 46 y.o.   MRN: [id number]  Chief Complaint  Patient presents with   Follow-up    2 mth follow up on the metformin. Refill on Atorvastin.    Referring provider: Fenton Foy, NP   HPI  46 year old male with history of asthma, sleep apnea, GERD, diabetes, vitamin D deficiency, hyperlipidemia, obesity.  Patient presents today for routine follow-up.  A1c in office today is 11.8.  Patient has been compliant with medications and is trying to do better with his diet.  Patient does complain of right lower tooth pain.  He states that he does need dental work but has not been able to get an appointment yet.  We discussed that we can send in a round of antibiotics for this.  Patient does have issues with insomnia as well.  We discussed that we can trial trazodone.  We will start him out at a low dose. Denies f/c/s, n/v/d, hemoptysis, PND, leg swelling Denies chest pain or edema   Right lower tooth apin and inflammation     No Known Allergies   There is no immunization history on file for this patient.  Past Medical History:  Diagnosis Date   Anxiety 01/2020   Asthma    GERD (gastroesophageal reflux disease)    Grief 01/2020   Hyperlipidemia, mixed 07/2019   Obesity    Vitamin D deficiency 07/2019    Tobacco History: Social History   Tobacco Use  Smoking Status Former   Types: Cigarettes   Quit date: 01/19/2018   Years since quitting: 4.1  Smokeless Tobacco Never   Counseling given: Not Answered   Outpatient Encounter Medications as of 03/05/2022  Medication Sig   albuterol (PROVENTIL) (2.5 MG/3ML) 0.083% nebulizer solution Take 3 mLs (2.5 mg total) by nebulization every 6 (six) hours as needed for wheezing or shortness of breath.   albuterol (VENTOLIN HFA) 108 (90 Base) MCG/ACT inhaler Inhale 1-2 puffs into the lungs every 6 (six) hours as needed for wheezing or shortness of breath.   amoxicillin-clavulanate  (AUGMENTIN) 875-125 MG tablet Take 1 tablet by mouth 2 (two) times daily.   blood glucose meter kit and supplies KIT Dispense based on patient and insurance preference. Use up to four times daily as directed.   budesonide-formoterol (SYMBICORT) 80-4.5 MCG/ACT inhaler Inhale 2 puffs into the lungs 2 (two) times daily.   cetirizine (ZYRTEC) 10 MG tablet TAKE 1 TABLET(10 MG) BY MOUTH DAILY   fluticasone (FLONASE) 50 MCG/ACT nasal spray Place 2 sprays into both nostrils daily.   guaiFENesin (MUCINEX) 600 MG 12 hr tablet Take 1 tablet (600 mg total) by mouth 2 (two) times daily.   hydrOXYzine (ATARAX) 10 MG tablet Take 1 tablet (10 mg total) by mouth 3 (three) times daily as needed.   ibuprofen (ADVIL) 800 MG tablet TAKE 1 TABLET(800 MG) BY MOUTH EVERY 8 HOURS AS NEEDED   metFORMIN (GLUCOPHAGE) 500 MG tablet Take 1 tablet (500 mg total) by mouth 2 (two) times daily with a meal for 7 days, THEN 2 tablets (1,000 mg total) 2 (two) times daily with a meal.   omeprazole (PRILOSEC) 40 MG capsule TAKE 1 CAPSULE(40 MG) BY MOUTH TWICE DAILY   traZODone (DESYREL) 50 MG tablet Take 0.5 tablets (25 mg total) by mouth at bedtime as needed for sleep.   atorvastatin (LIPITOR) 20 MG tablet Take 1 tablet (20 mg total) by mouth daily.   [DISCONTINUED] atorvastatin (LIPITOR) 20 MG tablet Take  1 tablet (20 mg total) by mouth daily.   No facility-administered encounter medications on file as of 03/05/2022.     Review of Systems  Review of Systems  Constitutional: Negative.   HENT:  Positive for dental problem.   Cardiovascular: Negative.   Gastrointestinal: Negative.   Allergic/Immunologic: Negative.   Neurological: Negative.   Psychiatric/Behavioral: Negative.         Physical Exam  BP (!) 137/92 (BP Location: Left Arm, Patient Position: Sitting, Cuff Size: Normal)   Pulse 86   Temp 97.6 F (36.4 C)   Resp 18   Ht 5' 9" (1.753 m)   Wt 266 lb (120.7 kg)   SpO2 98%   BMI 39.28 kg/m   Wt Readings  from Last 5 Encounters:  03/05/22 266 lb (120.7 kg)  01/03/22 268 lb 12.8 oz (121.9 kg)  12/04/21 267 lb 3.2 oz (121.2 kg)  02/20/21 275 lb 0.6 oz (124.8 kg)  08/19/20 278 lb (126.1 kg)     Physical Exam Vitals and nursing note reviewed.  Constitutional:      General: He is not in acute distress.    Appearance: He is well-developed.  HENT:     Mouth/Throat:     Dentition: Dental tenderness and dental caries present.   Cardiovascular:     Rate and Rhythm: Normal rate and regular rhythm.  Pulmonary:     Effort: Pulmonary effort is normal.     Breath sounds: Normal breath sounds.  Skin:    General: Skin is warm and dry.  Neurological:     Mental Status: He is alert and oriented to person, place, and time.      Lab Results:  CBC    Component Value Date/Time   WBC 7.1 01/03/2022 1500   WBC 6.5 08/16/2018 0158   RBC 5.83 (H) 01/03/2022 1500   RBC 5.75 08/16/2018 0158   HGB 15.7 01/03/2022 1500   HCT 46.8 01/03/2022 1500   PLT 220 01/03/2022 1500   MCV 80 01/03/2022 1500   MCH 26.9 01/03/2022 1500   MCH 28.2 08/16/2018 0158   MCHC 33.5 01/03/2022 1500   MCHC 33.8 08/16/2018 0158   RDW 13.0 01/03/2022 1500   LYMPHSABS 2.0 08/19/2020 1416   EOSABS 0.1 08/19/2020 1416   BASOSABS 0.1 08/19/2020 1416    BMET    Component Value Date/Time   NA 139 01/03/2022 1500   K 4.3 01/03/2022 1500   CL 96 01/03/2022 1500   CO2 25 01/03/2022 1500   GLUCOSE 233 (H) 01/03/2022 1500   GLUCOSE 111 (H) 08/16/2018 0158   BUN 9 01/03/2022 1500   CREATININE 0.76 01/03/2022 1500   CALCIUM 9.9 01/03/2022 1500   GFRNONAA 85 08/19/2020 1416   GFRAA 99 08/19/2020 1416    BNP No results found for: "BNP"  ProBNP No results found for: "PROBNP"  Imaging: No results found.   Assessment & Plan:   Insomnia - traZODone (DESYREL) 50 MG tablet; Take 0.5 tablets (25 mg total) by mouth at bedtime as needed for sleep.  Dispense: 15 tablet; Refill: 0  2. Pain due to dental caries  -  amoxicillin-clavulanate (AUGMENTIN) 875-125 MG tablet; Take 1 tablet by mouth 2 (two) times daily.  Dispense: 20 tablet; Refill: 0  3. Hyperlipidemia, unspecified hyperlipidemia type  - atorvastatin (LIPITOR) 20 MG tablet; Take 1 tablet (20 mg total) by mouth daily.  Dispense: 90 tablet; Refill: 3  4. Mixed hyperlipidemia  - atorvastatin (LIPITOR) 20 MG tablet; Take 1 tablet (20  mg total) by mouth daily.  Dispense: 90 tablet; Refill: 3   5. Type 2 diabetes mellitus with hyperglycemia, without long-term current use of insulin (HCC)  Lab Results  Component Value Date   HGBA1C 11.8 (A) 03/05/2022   HGBA1C 11.8 03/05/2022   HGBA1C 11.8 (A) 03/05/2022   HGBA1C 11.8 (A) 03/05/2022      Follow up:  Follow up in 3 months or sooner     Tonya S Nichols, NP 03/08/2022  

## 2022-03-05 NOTE — Patient Instructions (Addendum)
1. Insomnia, unspecified type  - traZODone (DESYREL) 50 MG tablet; Take 0.5 tablets (25 mg total) by mouth at bedtime as needed for sleep.  Dispense: 15 tablet; Refill: 0  2. Pain due to dental caries  - amoxicillin-clavulanate (AUGMENTIN) 875-125 MG tablet; Take 1 tablet by mouth 2 (two) times daily.  Dispense: 20 tablet; Refill: 0  3. Hyperlipidemia, unspecified hyperlipidemia type  - atorvastatin (LIPITOR) 20 MG tablet; Take 1 tablet (20 mg total) by mouth daily.  Dispense: 90 tablet; Refill: 3  4. Mixed hyperlipidemia  - atorvastatin (LIPITOR) 20 MG tablet; Take 1 tablet (20 mg total) by mouth daily.  Dispense: 90 tablet; Refill: 3   5. Type 2 diabetes mellitus with hyperglycemia, without long-term current use of insulin (HCC)  Lab Results  Component Value Date   HGBA1C 11.8 (A) 03/05/2022   HGBA1C 11.8 03/05/2022   HGBA1C 11.8 (A) 03/05/2022   HGBA1C 11.8 (A) 03/05/2022      Follow up:  Follow up in 3 months or sooner

## 2022-03-08 DIAGNOSIS — G4700 Insomnia, unspecified: Secondary | ICD-10-CM | POA: Insufficient documentation

## 2022-03-08 NOTE — Assessment & Plan Note (Signed)
-   traZODone (DESYREL) 50 MG tablet; Take 0.5 tablets (25 mg total) by mouth at bedtime as needed for sleep.  Dispense: 15 tablet; Refill: 0  2. Pain due to dental caries  - amoxicillin-clavulanate (AUGMENTIN) 875-125 MG tablet; Take 1 tablet by mouth 2 (two) times daily.  Dispense: 20 tablet; Refill: 0  3. Hyperlipidemia, unspecified hyperlipidemia type  - atorvastatin (LIPITOR) 20 MG tablet; Take 1 tablet (20 mg total) by mouth daily.  Dispense: 90 tablet; Refill: 3  4. Mixed hyperlipidemia  - atorvastatin (LIPITOR) 20 MG tablet; Take 1 tablet (20 mg total) by mouth daily.  Dispense: 90 tablet; Refill: 3   5. Type 2 diabetes mellitus with hyperglycemia, without long-term current use of insulin (HCC)  Lab Results  Component Value Date   HGBA1C 11.8 (A) 03/05/2022   HGBA1C 11.8 03/05/2022   HGBA1C 11.8 (A) 03/05/2022   HGBA1C 11.8 (A) 03/05/2022      Follow up:  Follow up in 3 months or sooner

## 2022-06-06 ENCOUNTER — Ambulatory Visit (INDEPENDENT_AMBULATORY_CARE_PROVIDER_SITE_OTHER): Payer: Self-pay | Admitting: Nurse Practitioner

## 2022-06-06 ENCOUNTER — Encounter: Payer: Self-pay | Admitting: Nurse Practitioner

## 2022-06-06 VITALS — BP 139/94 | HR 95 | Ht 69.0 in | Wt 271.0 lb

## 2022-06-06 DIAGNOSIS — E782 Mixed hyperlipidemia: Secondary | ICD-10-CM

## 2022-06-06 DIAGNOSIS — E785 Hyperlipidemia, unspecified: Secondary | ICD-10-CM

## 2022-06-06 DIAGNOSIS — E1165 Type 2 diabetes mellitus with hyperglycemia: Secondary | ICD-10-CM

## 2022-06-06 LAB — POCT GLYCOSYLATED HEMOGLOBIN (HGB A1C): HbA1c, POC (controlled diabetic range): 8.8 % — AB (ref 0.0–7.0)

## 2022-06-06 MED ORDER — ATORVASTATIN CALCIUM 20 MG PO TABS: 20.0000 mg | ORAL_TABLET | Freq: Every day | ORAL | 3 refills | Status: DC

## 2022-06-06 NOTE — Progress Notes (Signed)
Medication refills

## 2022-06-06 NOTE — Patient Instructions (Addendum)
1. Type 2 diabetes mellitus with hyperglycemia, without long-term current use of insulin (HCC)  - POCT glycosylated hemoglobin (Hb A1C) - CBC - Basic Metabolic Panel - atorvastatin (LIPITOR) 20 MG tablet; Take 1 tablet (20 mg total) by mouth daily.  Dispense: 90 tablet; Refill: 3  2. Hyperlipidemia, unspecified hyperlipidemia type  - atorvastatin (LIPITOR) 20 MG tablet; Take 1 tablet (20 mg total) by mouth daily.  Dispense: 90 tablet; Refill: 3  3. Mixed hyperlipidemia  - atorvastatin (LIPITOR) 20 MG tablet; Take 1 tablet (20 mg total) by mouth daily.  Dispense: 90 tablet; Refill: 3   Follow up:  Follow up in 3 months

## 2022-06-06 NOTE — Progress Notes (Signed)
_0  ID: Cole Morales, male    DOB: [birth year], 46 y.o.   MRN: [id number]  Chief Complaint  Patient presents with   Diabetes    Referring provider: Fenton Foy, NP   HPI  46 year old male with history of asthma, sleep apnea, GERD, diabetes, vitamin D deficiency, hyperlipidemia, obesity.  Patient presents today for follow-up on diabetes.  Overall patient has been doing well and is compliant with his medications.  He states that he has been trying to do better with his diabetic diet.  He is trying to stay active.  Patient states that he does need a refill on his inhaler today.  He also states that he has been having some anxiety especially at night when he tries to go to sleep because his mind is racing.  Patient would like something to take for anxiety.  We discussed that we can trial Vistaril for this.  Patient was also offered counseling but declines at this time. Denies f/c/s, n/v/d, hemoptysis, PND, chest pain or edema.  Stress eating because mother is sick - had a stroke      No Known Allergies   There is no immunization history on file for this patient.  Past Medical History:  Diagnosis Date   Anxiety 01/2020   Asthma    GERD (gastroesophageal reflux disease)    Grief 01/2020   Hyperlipidemia, mixed 07/2019   Obesity    Vitamin D deficiency 07/2019    Tobacco History: Social History   Tobacco Use  Smoking Status Former   Types: Cigarettes   Quit date: 01/19/2018   Years since quitting: 4.3  Smokeless Tobacco Never   Counseling given: Not Answered   Outpatient Encounter Medications as of 06/06/2022  Medication Sig   albuterol (PROVENTIL) (2.5 MG/3ML) 0.083% nebulizer solution Take 3 mLs (2.5 mg total) by nebulization every 6 (six) hours as needed for wheezing or shortness of breath.   albuterol (VENTOLIN HFA) 108 (90 Base) MCG/ACT inhaler Inhale 1-2 puffs into the lungs every 6 (six) hours as needed for wheezing or shortness of breath.   blood  glucose meter kit and supplies KIT Dispense based on patient and insurance preference. Use up to four times daily as directed.   budesonide-formoterol (SYMBICORT) 80-4.5 MCG/ACT inhaler Inhale 2 puffs into the lungs 2 (two) times daily.   cetirizine (ZYRTEC) 10 MG tablet TAKE 1 TABLET(10 MG) BY MOUTH DAILY   fluticasone (FLONASE) 50 MCG/ACT nasal spray Place 2 sprays into both nostrils daily.   hydrOXYzine (ATARAX) 10 MG tablet Take 1 tablet (10 mg total) by mouth 3 (three) times daily as needed.   ibuprofen (ADVIL) 800 MG tablet TAKE 1 TABLET(800 MG) BY MOUTH EVERY 8 HOURS AS NEEDED   metFORMIN (GLUCOPHAGE) 500 MG tablet Take 1 tablet (500 mg total) by mouth 2 (two) times daily with a meal for 7 days, THEN 2 tablets (1,000 mg total) 2 (two) times daily with a meal.   omeprazole (PRILOSEC) 40 MG capsule TAKE 1 CAPSULE(40 MG) BY MOUTH TWICE DAILY   [DISCONTINUED] atorvastatin (LIPITOR) 20 MG tablet Take 1 tablet (20 mg total) by mouth daily.   amoxicillin-clavulanate (AUGMENTIN) 875-125 MG tablet Take 1 tablet by mouth 2 (two) times daily. (Patient not taking: Reported on 06/06/2022)   atorvastatin (LIPITOR) 20 MG tablet Take 1 tablet (20 mg total) by mouth daily.   guaiFENesin (MUCINEX) 600 MG 12 hr tablet Take 1 tablet (600 mg total) by mouth 2 (two) times daily. (Patient not taking: Reported on  06/06/2022)   traZODone (DESYREL) 50 MG tablet Take 0.5 tablets (25 mg total) by mouth at bedtime as needed for sleep.   No facility-administered encounter medications on file as of 06/06/2022.     Review of Systems  Review of Systems  Constitutional: Negative.   HENT: Negative.    Cardiovascular: Negative.   Gastrointestinal: Negative.   Allergic/Immunologic: Negative.   Neurological: Negative.   Psychiatric/Behavioral: Negative.         Physical Exam  BP (!) 139/94   Pulse 95   Ht _0  (1.753 m)   Wt 271 lb (122.9 kg)   SpO2 98%   BMI 40.02 kg/m   Wt Readings from Last 5  Encounters:  06/06/22 271 lb (122.9 kg)  03/05/22 266 lb (120.7 kg)  01/03/22 268 lb 12.8 oz (121.9 kg)  12/04/21 267 lb 3.2 oz (121.2 kg)  02/20/21 275 lb 0.6 oz (124.8 kg)     Physical Exam Vitals and nursing note reviewed.  Constitutional:      General: He is not in acute distress.    Appearance: He is well-developed.  Cardiovascular:     Rate and Rhythm: Normal rate and regular rhythm.  Pulmonary:     Effort: Pulmonary effort is normal.     Breath sounds: Normal breath sounds.  Skin:    General: Skin is warm and dry.  Neurological:     Mental Status: He is alert and oriented to person, place, and time.       Assessment & Plan:   Type 2 diabetes mellitus with hyperglycemia, without long-term current use of insulin (HCC) - POCT glycosylated hemoglobin (Hb A1C) - CBC - Basic Metabolic Panel - atorvastatin (LIPITOR) 20 MG tablet; Take 1 tablet (20 mg total) by mouth daily.  Dispense: 90 tablet; Refill: 3  2. Hyperlipidemia, unspecified hyperlipidemia type  - atorvastatin (LIPITOR) 20 MG tablet; Take 1 tablet (20 mg total) by mouth daily.  Dispense: 90 tablet; Refill: 3  3. Mixed hyperlipidemia  - atorvastatin (LIPITOR) 20 MG tablet; Take 1 tablet (20 mg total) by mouth daily.  Dispense: 90 tablet; Refill: 3  Follow up:  Follow up in 3 months     Fenton Foy, NP 06/06/2022

## 2022-06-06 NOTE — Assessment & Plan Note (Signed)
-   POCT glycosylated hemoglobin (Hb A1C) - CBC - Basic Metabolic Panel - atorvastatin (LIPITOR) 20 MG tablet; Take 1 tablet (20 mg total) by mouth daily.  Dispense: 90 tablet; Refill: 3  2. Hyperlipidemia, unspecified hyperlipidemia type  - atorvastatin (LIPITOR) 20 MG tablet; Take 1 tablet (20 mg total) by mouth daily.  Dispense: 90 tablet; Refill: 3  3. Mixed hyperlipidemia  - atorvastatin (LIPITOR) 20 MG tablet; Take 1 tablet (20 mg total) by mouth daily.  Dispense: 90 tablet; Refill: 3  Follow up:  Follow up in 3 months

## 2022-06-07 ENCOUNTER — Telehealth: Payer: Self-pay

## 2022-06-07 NOTE — Chronic Care Management (AMB) (Signed)
   Care Guide Note  06/07/2022 Name: Cole Morales MRN: [id number] DOB: July 22, 1976  Referred by: Fenton Foy, NP Reason for referral : Care Coordination (Outreach to schedule referral with Pharm D)   Cole Morales is a 46 y.o. year old male who is a primary care patient of Fenton Foy, NP. Sissy Hoff was referred to the pharmacist for assistance related to DM.    An unsuccessful telephone outreach was attempted today to contact the patient who was referred to the pharmacy team for assistance with medication management. Additional attempts will be made to contact the patient.   Noreene Larsson, [place],  [address] Direct Dial: [phone] [email or web address]

## 2022-06-07 NOTE — Chronic Care Management (AMB) (Signed)
   Care Guide Note  06/07/2022 Name: Cole Morales MRN: [id number] DOB: November 04, 1975  Referred by: Fenton Foy, NP Reason for referral : Care Coordination (Outreach to schedule referral with Pharm D)   Cole Morales is a 46 y.o. year old male who is a primary care patient of Fenton Foy, NP. Sissy Hoff was referred to the pharmacist for assistance related to DM.    Successful contact was made with the patient to discuss pharmacy services including being ready for the pharmacist to call at least 5 minutes before the scheduled appointment time, to have medication bottles and any blood sugar or blood pressure readings ready for review. The patient agreed to meet with the pharmacist via with the pharmacist via telephone visit on 07/03/2022.    Noreene Larsson, [place], [place] [address] Direct Dial: [phone] [email or web address]

## 2022-06-08 ENCOUNTER — Other Ambulatory Visit: Payer: Self-pay | Admitting: Nurse Practitioner

## 2022-06-08 ENCOUNTER — Other Ambulatory Visit: Payer: Self-pay | Admitting: Pharmacist

## 2022-06-08 DIAGNOSIS — N289 Disorder of kidney and ureter, unspecified: Secondary | ICD-10-CM

## 2022-06-08 LAB — CBC
Hematocrit: 46.7 % (ref 37.5–51.0)
Hemoglobin: 15.5 g/dL (ref 13.0–17.7)
MCH: 27.4 pg (ref 26.6–33.0)
MCHC: 33.2 g/dL (ref 31.5–35.7)
MCV: 83 fL (ref 79–97)
Platelets: 238 10*3/uL (ref 150–450)
RBC: 5.66 x10E6/uL (ref 4.14–5.80)
RDW: 13.5 % (ref 11.6–15.4)
WBC: 8.2 10*3/uL (ref 3.4–10.8)

## 2022-06-08 LAB — BASIC METABOLIC PANEL
BUN/Creatinine Ratio: 4 — ABNORMAL LOW (ref 9–20)
BUN: 44 mg/dL — ABNORMAL HIGH (ref 6–24)
CO2: 16 mmol/L — ABNORMAL LOW (ref 20–29)
Calcium: 10 mg/dL (ref 8.7–10.2)
Chloride: 108 mmol/L — ABNORMAL HIGH (ref 96–106)
Creatinine, Ser: 10.5 mg/dL — ABNORMAL HIGH (ref 0.76–1.27)
Glucose: 73 mg/dL (ref 70–99)
Potassium: 4.1 mmol/L (ref 3.5–5.2)
Sodium: 143 mmol/L (ref 134–144)
eGFR: 6 mL/min/{1.73_m2} — ABNORMAL LOW (ref >59)

## 2022-06-08 NOTE — Progress Notes (Signed)
Received message from PCP regarding patient's renal function and any medication recommendations.   Recommend he hold metformin over the weekend until rechecking.   Drinking more coffee lately, reports he has likely been dehydrated. Confirms avoidance of ibuprofen, aleve, BC powders.   Will await recheck next week.   Catie Hedwig Morton, PharmD, [facility] [phone]

## 2022-06-14 ENCOUNTER — Other Ambulatory Visit: Payer: BLUE CROSS/BLUE SHIELD

## 2022-06-14 DIAGNOSIS — N289 Disorder of kidney and ureter, unspecified: Secondary | ICD-10-CM

## 2022-06-15 LAB — BASIC METABOLIC PANEL
BUN/Creatinine Ratio: 10 (ref 9–20)
BUN: 9 mg/dL (ref 6–24)
CO2: 24 mmol/L (ref 20–29)
Calcium: 9.6 mg/dL (ref 8.7–10.2)
Chloride: 101 mmol/L (ref 96–106)
Creatinine, Ser: 0.86 mg/dL (ref 0.76–1.27)
Glucose: 143 mg/dL — ABNORMAL HIGH (ref 70–99)
Potassium: 4.2 mmol/L (ref 3.5–5.2)
Sodium: 141 mmol/L (ref 134–144)
eGFR: 107 mL/min/{1.73_m2} (ref >59)

## 2022-06-25 ENCOUNTER — Telehealth: Payer: Self-pay | Admitting: Nurse Practitioner

## 2022-06-25 NOTE — Telephone Encounter (Signed)
Please give pt a call regarding medications - inhaler

## 2022-06-27 ENCOUNTER — Telehealth: Payer: Self-pay | Admitting: Nurse Practitioner

## 2022-06-27 NOTE — Telephone Encounter (Signed)
Pt requesting inhaler refill and a call back [phone]

## 2022-06-27 NOTE — Telephone Encounter (Signed)
Lvm for pt to call back. KH 

## 2022-06-28 ENCOUNTER — Other Ambulatory Visit: Payer: Self-pay | Admitting: Nurse Practitioner

## 2022-06-28 DIAGNOSIS — J45909 Unspecified asthma, uncomplicated: Secondary | ICD-10-CM

## 2022-06-28 MED ORDER — ALBUTEROL SULFATE HFA 108 (90 BASE) MCG/ACT IN AERS: 1.0000 | INHALATION_SPRAY | Freq: Four times a day (QID) | RESPIRATORY_TRACT | 0 refills | Status: DC | PRN

## 2022-06-28 NOTE — Telephone Encounter (Signed)
I have sent refill to pharmacy. Please call to let patient know. Thanks.

## 2022-06-29 NOTE — Telephone Encounter (Signed)
Pt called back and advised he needed the symbicort refilled. Unfortunately his insurance was messed up and he can't afford the refill. Pt was told to call the pharmacy and let him know what  is similar and he can afford and we could write for that once he lets [place] know.   Renelda Loma RMA

## 2022-06-29 NOTE — Telephone Encounter (Signed)
Lvm for pt to advise of med being sent. Kh

## 2022-07-03 ENCOUNTER — Other Ambulatory Visit: Payer: BLUE CROSS/BLUE SHIELD | Admitting: Pharmacist

## 2022-07-03 NOTE — Progress Notes (Signed)
07/03/2022 Name: Cole Morales MRN: [id number] DOB: Jul 03, 1976  Chief Complaint  Patient presents with   Medication Management   Diabetes   Hypertension   Hyperlipidemia    Cole Morales is a 46 y.o. year old male who presented for a telephone visit.   They were referred to the pharmacist by their PCP for assistance in managing diabetes, hypertension, and hyperlipidemia.   Subjective:  Care Team: Primary Care Provider: Fenton Foy, NP ; Next Scheduled Visit: 09/06/22  Medication Access/Adherence  Current Pharmacy:  [facility] #[id number] - [place], [place] - [address] [place] [address] [place] [address] [place] [address] Phone: [phone] Fax: [phone]  [facility] [employer], [place] [address] Phone: [phone] Fax: [phone]   Patient reports affordability concerns with their medications: No  Patient reports access/transportation concerns to their pharmacy: No  Patient reports adherence concerns with their medications:  No     Diabetes:  Current medications: metformin 1000 mg twice daily - but taking 500 mg twice daily. Reports stomach upset with higher doses. Has not tried XR metformin before  Reports a "bad habit" of wanting sweets. Worst issues with pre-bedtime snacking  Current glucose readings: not checking, just picked up meter, strips, lancets  Patient denies hypoglycemic s/sx including dizziness, shakiness, sweating.   Current meal patterns:  - Breakfast: piece of fruit on the way to work - Lunch: fast food (sandwich, burger, fries); or sandwich at home - Supper: mom is a diabetic, so tries to minimize carbs - meatloaf with vegetables; sandwiches; wheat  - Snacks: chips, pudding cups - zero sugar  - Drinks: water, periodic powder   Current physical activity: lifts a lot at work; has dumb bells in his room for strength  training  Hypertension:  Current medications: none  Patient has a validated, automated, upper arm home BP cuff Current blood pressure readings readings: up to 174 systolic, but generally 081/44Y   Patient denies hypotensive s/sx including dizziness, lightheadedness.  Patient denies hypertensive symptoms including headache, chest pain, shortness of breath  Current meal patterns: as above  Current physical activity: as above   Hyperlipidemia/ASCVD Risk Reduction  Current lipid lowering medications: atorvastatin 20 mg daily - reports he has not been able to pick up a refill yet as there was an issue with his insurance coverage, but it should be fixed 12/1 and he will fill atorvastatin at that time   Health Maintenance  Health Maintenance Due  Topic Date Due   COVID-19 Vaccine (1) Never done   FOOT EXAM  Never done   OPHTHALMOLOGY EXAM  Never done   Diabetic kidney evaluation - Urine ACR  Never done   COLONOSCOPY (Pts 45-41yr Insurance coverage will need to be confirmed)  Never done     Objective: Lab Results  Component Value Date   HGBA1C 8.8 (A) 06/06/2022    Lab Results  Component Value Date   CREATININE 0.86 06/14/2022   BUN 9 06/14/2022   NA 141 06/14/2022   K 4.2 06/14/2022   CL 101 06/14/2022   CO2 24 06/14/2022    Lab Results  Component Value Date   CHOL 164 12/04/2021   HDL 28 (L) 12/04/2021   LDLCALC 70 12/04/2021   TRIG 419 (H) 12/04/2021   CHOLHDL 5.9 (H) 12/04/2021    Medications Reviewed Today     Reviewed by HOsker Mason RPH-CPP (  Pharmacist) on 07/03/22 at [place] List Status: <None>   Medication Order Taking? Sig Documenting Provider Last Dose Status Informant  albuterol (PROVENTIL) (2.5 MG/3ML) 0.083% nebulizer solution [id number] No Take 3 mLs (2.5 mg total) by nebulization every 6 (six) hours as needed for wheezing or shortness of breath.  Patient not taking: Reported on 07/03/2022   Fenton Foy, NP Not Taking Active    albuterol (VENTOLIN HFA) 108 (90 Base) MCG/ACT inhaler [id number] No Inhale 1-2 puffs into the lungs every 6 (six) hours as needed for wheezing or shortness of breath.  Patient not taking: Reported on 07/03/2022   Fenton Foy, NP Not Taking Active   atorvastatin (LIPITOR) 20 MG tablet [id number] No Take 1 tablet (20 mg total) by mouth daily.  Patient not taking: Reported on 07/03/2022   Fenton Foy, NP Not Taking Active   blood glucose meter kit and supplies KIT [id number]  Dispense based on patient and insurance preference. Use up to four times daily as directed. Vevelyn Francois, NP  Active   budesonide-formoterol [facility]) 80-4.5 MCG/ACT inhaler [id number] Yes Inhale 2 puffs into the lungs 2 (two) times daily. Dorena Dew, FNP Taking Active   cetirizine (ZYRTEC) 10 MG tablet [id number] Yes TAKE 1 TABLET(10 MG) BY MOUTH DAILY Fenton Foy, NP Taking Active   fluticasone (FLONASE) 50 MCG/ACT nasal spray [id number] Yes Place 2 sprays into both nostrils daily. Vevelyn Francois, NP Taking Active   ibuprofen (ADVIL) 800 MG tablet [id number]  TAKE 1 TABLET(800 MG) BY MOUTH EVERY 8 HOURS AS NEEDED Azzie Glatter, FNP  Active   metFORMIN (GLUCOPHAGE) 500 MG tablet [id number] Yes Take 1 tablet (500 mg total) by mouth 2 (two) times daily with a meal for 7 days, THEN 2 tablets (1,000 mg total) 2 (two) times daily with a meal. Fenton Foy, NP Taking Active            Med Note [place] Royal Jul 03, 2022  3:33 PM) 1 tablet twice daily  omeprazole (PRILOSEC) 40 MG capsule [id number] Yes TAKE 1 CAPSULE(40 MG) BY MOUTH TWICE DAILY Azzie Glatter, FNP Taking Active            Med Note (LONG, ASHLEY L   Mon Dec 04, 2021  1:14 PM) otc  traZODone (DESYREL) 50 MG tablet [id number] Yes Take 0.5 tablets (25 mg total) by mouth at bedtime as needed for sleep. Fenton Foy, NP Taking Active               Assessment/Plan:   Diabetes: - Currently uncontrolled but  improving - Reviewed long term cardiovascular and renal outcomes of uncontrolled blood sugar - Reviewed goal A1c, goal fasting, and goal 2 hour post prandial glucose - Reviewed dietary modifications including: reducing carbohydrate portion sizes, low carb snacks. Provided encouragement regarding the changes he has already made - Reviewed lifestyle modifications including: praised for 150 minutes of exercise weekly, encouraged increase in duration and/or intensity - Recommend to switch to metformin XR and titrate to 1000 mg twice daily or add GLP1. Counseled on mechanism of action, side effects of GLP1. Patient elects to check blood sugars twice daily for ~ 4 weeks and we will re-visit therapy adjustment at that time  - Recommend to check glucose twice daily, fasting and 2 hour post prandial   Hypertension: - Currently uncontrolled - Reviewed long term cardiovascular and renal outcomes of uncontrolled blood pressure - Reviewed appropriate blood pressure  monitoring technique and reviewed goal blood pressure. Recommended to check home blood pressure and heart rate daily. Will evaluate home readings over the next 4 weeks and discuss need for therapy at that time  Hyperlipidemia/ASCVD Risk Reduction: - Currently controlled per last LDL at goal of 70 - Recommend to continue current regimen at this time by restarting atorvastatin when insurance coverage takes effect    Follow Up Plan: phone call in 4 weeks  Catie Hedwig Morton, PharmD, [place] [phone]

## 2022-08-01 ENCOUNTER — Other Ambulatory Visit: Payer: BLUE CROSS/BLUE SHIELD | Admitting: Pharmacist

## 2022-08-02 ENCOUNTER — Other Ambulatory Visit: Payer: BLUE CROSS/BLUE SHIELD | Admitting: Pharmacist

## 2022-08-02 NOTE — Progress Notes (Signed)
08/02/2022 Name: Cole Morales MRN: [id number] DOB: [birth year]  Chief Complaint  Patient presents with   Medication Management   Diabetes    Cole Morales is a 46 y.o. year old male who presented for a telephone visit.   They were referred to the pharmacist by their PCP for assistance in managing diabetes and hypertension.    Subjective:  Care Team: Primary Care Provider: Fenton Foy, NP ; Next Scheduled Visit: 09/06/22  Medication Access/Adherence  Current Pharmacy:  [facility] #[id number] - [place], [place] - [address] [place] [address] [place] [address] Phone: [phone] Fax: [phone]  [place] [address] [place] [address] Phone: [phone] Fax: [phone]   Patient reports affordability concerns with their medications: Yes  Patient reports access/transportation concerns to their pharmacy: No  Patient reports adherence concerns with their medications:  Yes  notes there was issues with his insurance that were just resolved. He has not had testing supplies and has not been checking glucose.    Diabetes:  Current medications: metformin 500 mg twice daily- max tolerated dose; we discussed changing to XR last time but he declined; higher doses of metformin IR caused diarrhea.  Current glucose readings: has not been checking  Hypertension:  Current medications: none  Patient has an automated, wrist BP cuff Current blood pressure readings readings: variable; 350K-938H systolic,   Patient denies hypertensive symptoms including headache, chest pain, shortness of breath  Hyperlipidemia/ASCVD Risk Reduction  Current lipid lowering medications: atorvastatin 20 mg daily - reports he was able to pick this up and start it   Objective: Lab Results  Component Value Date   HGBA1C 8.8 (A) 06/06/2022    Lab Results  Component  Value Date   CREATININE 0.86 06/14/2022   BUN 9 06/14/2022   NA 141 06/14/2022   K 4.2 06/14/2022   CL 101 06/14/2022   CO2 24 06/14/2022    Lab Results  Component Value Date   CHOL 164 12/04/2021   HDL 28 (L) 12/04/2021   LDLCALC 70 12/04/2021   TRIG 419 (H) 12/04/2021   CHOLHDL 5.9 (H) 12/04/2021    Medications Reviewed Today     Reviewed by Osker Mason, RPH-CPP (Pharmacist) on 08/02/22 at 12  Med List Status: <None>   Medication Order Taking? Sig Documenting Provider Last Dose Status Informant  albuterol (PROVENTIL) (2.5 MG/3ML) 0.083% nebulizer solution [id number]  Take 3 mLs (2.5 mg total) by nebulization every 6 (six) hours as needed for wheezing or shortness of breath.  Patient not taking: Reported on 07/03/2022   Fenton Foy, NP  Active   albuterol (VENTOLIN HFA) 108 (90 Base) MCG/ACT inhaler [id number]  Inhale 1-2 puffs into the lungs every 6 (six) hours as needed for wheezing or shortness of breath.  Patient not taking: Reported on 07/03/2022   Fenton Foy, NP  Active   atorvastatin (LIPITOR) 20 MG tablet [id number] Yes Take 1 tablet (20 mg total) by mouth daily. Fenton Foy, NP Taking Active   blood glucose meter kit and supplies KIT [id number] Yes Dispense based on patient and insurance preference. Use up to four times daily as directed. Vevelyn Francois, NP Taking Active   budesonide-formoterol [facility]) 80-4.5 MCG/ACT inhaler [id number] Yes Inhale 2 puffs into the lungs 2 (two) times daily. Dorena Dew, FNP Taking Active   cetirizine (ZYRTEC) 10 MG  tablet [id number] Yes TAKE 1 TABLET(10 MG) BY MOUTH DAILY Nichols, Tonya S, NP Taking Active   fluticasone (FLONASE) 50 MCG/ACT nasal spray [id number] Yes Place 2 sprays into both nostrils daily. King, Crystal M, NP Taking Active   ibuprofen (ADVIL) 800 MG tablet [id number] No TAKE 1 TABLET(800 MG) BY MOUTH EVERY 8 HOURS AS NEEDED  Patient not taking: Reported on 08/02/2022   Stroud, Natalie M, FNP  Not Taking Active   metFORMIN (GLUCOPHAGE) 500 MG tablet [id number] Yes Take 1 tablet (500 mg total) by mouth 2 (two) times daily with a meal for 7 days, THEN 2 tablets (1,000 mg total) 2 (two) times daily with a meal. Nichols, Tonya S, NP Taking Active            Med Note (HARPER, CATHERINE T   Thu Aug 02, 2022  2:36 PM)    omeprazole (PRILOSEC) 40 MG capsule [id number] Yes TAKE 1 CAPSULE(40 MG) BY MOUTH TWICE DAILY Stroud, Natalie M, FNP Taking Active            Med Note (LONG, ASHLEY L   Mon Dec 04, 2021  1:14 PM) otc  traZODone (DESYREL) 50 MG tablet [id number] No Take 0.5 tablets (25 mg total) by mouth at bedtime as needed for sleep.  Patient not taking: Reported on 07/03/2022   Nichols, Tonya S, NP Not Taking Active               Assessment/Plan:   Diabetes: - Currently uncontrolled. Discussed changing to metformin XR to attempt to titrate dose, but patient just filled metformin IR and preferred to use the supply. Recommend to change to metformin XR 500 mg, 2 tablets twice daily at next visit.  - Recommend to check glucose daily  Hypertension: - Currently unknown control due to wrist cuff - Discussed recommendations for [employer] upper arm BP cuff. Patient notes he will look into purchasing. Encouraged to check home readings periodically and report at upcoming PCP visit  Hyperlipidemia/ASCVD Risk Reduction: - Currently uncontrolled but just started on therapy - Recommend to continue current regimen and check lipids with next visit.    Follow Up Plan: PCP in 4 weeks, phone call with pharmacist in ~ 8   Catie T. Harper, PharmD, BCACP [facility] [phone]    

## 2022-08-10 ENCOUNTER — Other Ambulatory Visit: Payer: Self-pay | Admitting: Nurse Practitioner

## 2022-08-10 DIAGNOSIS — E1165 Type 2 diabetes mellitus with hyperglycemia: Secondary | ICD-10-CM

## 2022-09-06 ENCOUNTER — Ambulatory Visit: Payer: Self-pay | Admitting: Nurse Practitioner

## 2022-09-19 ENCOUNTER — Ambulatory Visit: Payer: Self-pay | Admitting: Nurse Practitioner

## 2022-09-26 ENCOUNTER — Ambulatory Visit: Payer: Self-pay | Admitting: Nurse Practitioner

## 2022-10-02 ENCOUNTER — Other Ambulatory Visit: Payer: BLUE CROSS/BLUE SHIELD | Admitting: Pharmacist

## 2022-10-02 MED ORDER — METFORMIN HCL ER 500 MG PO TB24: 500.0000 mg | ORAL_TABLET | Freq: Two times a day (BID) | ORAL | 1 refills | Status: AC

## 2022-10-02 NOTE — Progress Notes (Signed)
10/02/2022 Name: Cole Morales MRN: [id number] DOB: [birth year]  Chief Complaint  Patient presents with   Medication Management   Diabetes   Hypertension    Casimier Dumitrescu is a 47 y.o. year old male who presented for a telephone visit.   They were referred to the pharmacist by their PCP for assistance in managing diabetes.   Subjective:  Care Team: Primary Care Provider: Fenton Foy, NP ; Next Scheduled Visit: not scheduled   Medication Access/Adherence  Current Pharmacy:  [facility] [place], [place] - [address] OF [place] [address] [place] [address] Phone: [phone] Fax: [phone]  [place] [address] [place] [address] Phone: [phone] Fax: [phone]   Patient reports affordability concerns with their medications: No  Patient reports access/transportation concerns to their pharmacy: No  Patient reports adherence concerns with their medications:  No    Reports he keeps having issues with BCBS where it is not active. He is asking an insurance agent to look into other plans he may be eligible for, including Medicaid  Diabetes:  Current medications: metformin IR 500 mg twice daily - unable to increase dose due to diarrhea  Current glucose readings: not checking, reports he has been very busy lately with several family issues  Patient denies hypoglycemic s/sx including dizziness, shakiness, sweating. Patient denies hyperglycemic symptoms including polyuria, polydipsia, polyphagia, nocturia, neuropathy, blurred vision.   Hyperlipidemia/ASCVD Risk Reduction  Current lipid lowering medications: atorvastatin 20 mg daily   Objective:  Lab Results  Component Value Date   HGBA1C 8.8 (A) 06/06/2022    Lab Results  Component Value Date   CREATININE 0.86 06/14/2022   BUN 9 06/14/2022   NA 141 06/14/2022   K 4.2  06/14/2022   CL 101 06/14/2022   CO2 24 06/14/2022    Lab Results  Component Value Date   CHOL 164 12/04/2021   HDL 28 (L) 12/04/2021   LDLCALC 70 12/04/2021   TRIG 419 (H) 12/04/2021   CHOLHDL 5.9 (H) 12/04/2021    Medications Reviewed Today     Reviewed by Osker Mason, RPH-CPP (Pharmacist) on 10/02/22 at 1433  Med List Status: <None>   Medication Order Taking? Sig Documenting Provider Last Dose Status Informant  albuterol (PROVENTIL) (2.5 MG/3ML) 0.083% nebulizer solution [id number]  Take 3 mLs (2.5 mg total) by nebulization every 6 (six) hours as needed for wheezing or shortness of breath.  Patient not taking: Reported on 07/03/2022   Fenton Foy, NP  Active   albuterol (VENTOLIN HFA) 108 (90 Base) MCG/ACT inhaler [id number]  Inhale 1-2 puffs into the lungs every 6 (six) hours as needed for wheezing or shortness of breath.  Patient not taking: Reported on 07/03/2022   Fenton Foy, NP  Active   atorvastatin (LIPITOR) 20 MG tablet [id number] Yes Take 1 tablet (20 mg total) by mouth daily. Fenton Foy, NP Taking Active   blood glucose meter kit and supplies KIT [id number]  Dispense based on patient and insurance preference. Use up to four times daily as directed. Vevelyn Francois, NP  Active   budesonide-formoterol [facility]) 80-4.5 MCG/ACT inhaler [id number] Yes Inhale 2 puffs into the lungs 2 (two) times daily. Dorena Dew, FNP Taking Active   cetirizine (ZYRTEC) 10 MG tablet [id number] Yes TAKE 1 TABLET(10 MG) BY MOUTH DAILY Fenton Foy, NP Taking Active  fluticasone (FLONASE) 50 MCG/ACT nasal spray [id number] Yes Place 2 sprays into both nostrils daily. Vevelyn Francois, NP Taking Active   ibuprofen (ADVIL) 800 MG tablet [id number]  TAKE 1 TABLET(800 MG) BY MOUTH EVERY 8 HOURS AS NEEDED  Patient not taking: Reported on 08/02/2022   Azzie Glatter, FNP  Active   metFORMIN (GLUCOPHAGE) 500 MG tablet [id number] Yes Take 1 tablet (500 mg total) by mouth 2  (two) times daily with a meal for 7 days, THEN 2 tablets (1,000 mg total) 2 (two) times daily with a meal. Fenton Foy, NP Taking Active            Med Note Margarite Gouge Aug 02, 2022  2:36 PM)    omeprazole (PRILOSEC) 40 MG capsule [id number] Yes TAKE 1 CAPSULE(40 MG) BY MOUTH TWICE DAILY Azzie Glatter, FNP Taking Active            Med Note (LONG, ASHLEY L   Mon Dec 04, 2021  1:14 PM) otc  traZODone (DESYREL) 50 MG tablet [id number] No Take 0.5 tablets (25 mg total) by mouth at bedtime as needed for sleep.  Patient not taking: Reported on 07/03/2022   Fenton Foy, NP Not Taking Active               Assessment/Plan:   Diabetes: - Currently uncontrolled - Reviewed goal A1c, goal fasting, and goal 2 hour post prandial glucose - Recommend to change to metformin XR 500 mg daily for better expected tolerability and ability to increase dose moving forward.  - Recommend to check glucose periodically, alternating between fasting and 2 hour post prandial    Hyperlipidemia/ASCVD Risk Reduction: - Currently controlled.  - Recommend to continue current regimen at this time  Follow Up Plan: phone call in 6 weeks  Catie TJodi Mourning, PharmD, [place] [phone]

## 2022-11-13 ENCOUNTER — Other Ambulatory Visit: Payer: BLUE CROSS/BLUE SHIELD | Admitting: Pharmacist

## 2022-11-13 ENCOUNTER — Telehealth: Payer: Self-pay | Admitting: Pharmacist

## 2022-11-13 NOTE — Progress Notes (Signed)
Attempted to contact patient for scheduled appointment for medication management. Left HIPAA compliant message for patient to return my call at their convenience.    Catie T. Kynan Peasley, PharmD, BCACP, CPP [facility] [phone]  

## 2022-12-10 ENCOUNTER — Other Ambulatory Visit: Payer: Self-pay

## 2022-12-10 ENCOUNTER — Other Ambulatory Visit: Payer: BLUE CROSS/BLUE SHIELD | Admitting: Pharmacist

## 2022-12-10 DIAGNOSIS — E1165 Type 2 diabetes mellitus with hyperglycemia: Secondary | ICD-10-CM

## 2022-12-10 DIAGNOSIS — E785 Hyperlipidemia, unspecified: Secondary | ICD-10-CM

## 2022-12-10 DIAGNOSIS — E782 Mixed hyperlipidemia: Secondary | ICD-10-CM

## 2022-12-10 MED ORDER — ATORVASTATIN CALCIUM 20 MG PO TABS
20.0000 mg | ORAL_TABLET | Freq: Every day | ORAL | 3 refills | Status: DC
  Filled 2022-12-10: qty 90, 90d supply, fill #0
  Filled 2023-03-11 – 2023-03-12 (×2): qty 90, 90d supply, fill #1
  Filled 2023-06-13: qty 90, 90d supply, fill #2
  Filled 2023-09-13 (×2): qty 90, 90d supply, fill #3

## 2022-12-10 NOTE — Progress Notes (Signed)
12/10/2022 Name: Cole Morales MRN: [id number] DOB: 08-16-1975  Chief Complaint  Patient presents with   Medication Management   Diabetes    Sheamus Hasting is a 47 y.o. year old male who presented for a telephone visit.   They were referred to the pharmacist by their PCP for assistance in managing diabetes, hypertension, and hyperlipidemia.    Subjective:  Care Team: Primary Care Provider: Ivonne Andrew, NP ; Next Scheduled Visit: not scheduled   Medication Access/Adherence  Current Pharmacy:  [id number] - [place], [place] - [address] AT [facility] [address] [place] [address] Phone: [phone] Fax: [phone]  [facility] [address] [place] [address] Phone: [phone] Fax: [phone]   Patient reports affordability concerns with their medications: Yes  Patient reports access/transportation concerns to their pharmacy: No  Patient reports adherence concerns with their medications:  Yes    Insurance is still not active. Reports his agent is working to evaluate options, including Medicaid, and should have a plan by June 1st.    Diabetes:  Current medications: metformin XR 500 mg twice daily - has not been taking, but just acquired supply  Current glucose readings: has not been checking, meter broke   Hyperlipidemia/ASCVD Risk Reduction  Current lipid lowering medications: atorvastatin 20 mg daily - has been out for a few weeks  Asthma:  Current medications: Symbicort 160/4.5 mcg daily - patient has been able to fill generic  Objective:  Lab Results  Component Value Date   HGBA1C 8.8 (A) 06/06/2022    Lab Results  Component Value Date   CREATININE 0.86 06/14/2022   BUN 9 06/14/2022   NA 141 06/14/2022   K 4.2 06/14/2022   CL 101 06/14/2022   CO2 24 06/14/2022    Lab Results  Component Value Date   CHOL 164  12/04/2021   HDL 28 (L) 12/04/2021   LDLCALC 70 12/04/2021   TRIG 419 (H) 12/04/2021   CHOLHDL 5.9 (H) 12/04/2021    Medications Reviewed Today     Reviewed by Alden Hipp, RPH-CPP (Pharmacist) on 10/02/22 at 1433  Med List Status: <None>   Medication Order Taking? Sig Documenting Provider Last Dose Status Informant  albuterol (PROVENTIL) (2.5 MG/3ML) 0.083% nebulizer solution [id number]  Take 3 mLs (2.5 mg total) by nebulization every 6 (six) hours as needed for wheezing or shortness of breath.  Patient not taking: Reported on 07/03/2022   Ivonne Andrew, NP  Active   albuterol (VENTOLIN HFA) 108 (90 Base) MCG/ACT inhaler [id number]  Inhale 1-2 puffs into the lungs every 6 (six) hours as needed for wheezing or shortness of breath.  Patient not taking: Reported on 07/03/2022   Ivonne Andrew, NP  Active   atorvastatin (LIPITOR) 20 MG tablet [id number] Yes Take 1 tablet (20 mg total) by mouth daily. Ivonne Andrew, NP Taking Active   blood glucose meter kit and supplies KIT [id number]  Dispense based on patient and insurance preference. Use up to four times daily as directed. Barbette Merino, NP  Active   budesonide-formoterol [facility]) 80-4.5 MCG/ACT inhaler [id number] Yes Inhale 2 puffs into the lungs 2 (two) times daily. Massie Maroon, FNP Taking Active   cetirizine (ZYRTEC) 10 MG tablet [id number] Yes TAKE 1 TABLET(10 MG) BY MOUTH DAILY Ivonne Andrew, NP Taking Active   fluticasone (FLONASE) 50 MCG/ACT nasal spray  [id number] Yes Place 2 sprays into both nostrils daily. Barbette Merino, NP Taking Active   ibuprofen (ADVIL) 800 MG tablet [id number]  TAKE 1 TABLET(800 MG) BY MOUTH EVERY 8 HOURS AS NEEDED  Patient not taking: Reported on 08/02/2022   Kallie Locks, FNP  Active   metFORMIN (GLUCOPHAGE) 500 MG tablet [id number] Yes Take 1 tablet (500 mg total) by mouth 2 (two) times daily with a meal for 7 days, THEN 2 tablets (1,000 mg total) 2 (two) times daily with a meal.  Ivonne Andrew, NP Taking Active            Med Note Merla Riches Aug 02, 2022  2:36 PM)    omeprazole (PRILOSEC) 40 MG capsule [id number] Yes TAKE 1 CAPSULE(40 MG) BY MOUTH TWICE DAILY Kallie Locks, FNP Taking Active            Med Note (LONG, ASHLEY L   Mon Dec 04, 2021  1:14 PM) otc  traZODone (DESYREL) 50 MG tablet [id number] No Take 0.5 tablets (25 mg total) by mouth at bedtime as needed for sleep.  Patient not taking: Reported on 07/03/2022   Ivonne Andrew, NP Not Taking Active               Assessment/Plan:   Diabetes: - Currently uncontrolled - Patient will restart metformin XR 500 mg twice daily - Recommend to contact glucometer manufacturer for troubleshooting support and/or to see if they would be able to replace for free.  - Recommend to check glucose periodically, alternating between fasting and 2 hour post prandial    Hyperlipidemia/ASCVD Risk Reduction: - Currently untreated.  - Recommend to send refill to [facility] supply. Discussed with PCP, they are in agreement. Order placed for cosign.   Asthma: - Currently controlled.  - Recommend to continue current regimen at this time   Follow Up Plan: phone call in 6 weeks  Catie TClearance Coots, PharmD, BCACP, CPP [facility] [phone]

## 2022-12-10 NOTE — Patient Instructions (Signed)
[place],   It was great talking to you today!  Take metformin XR 500 mg twice daily. Call your glucometer manufacturer support and see if they can help you troubleshoot the broken screen (or replace it for free).   [facility] ([address] [place]) will fill your atorvastatin.   I'll call you in ~ 6 weeks for follow up.   Thanks!  Catie Eppie Gibson, PharmD, BCACP, CPP [facility] [phone]

## 2023-01-03 ENCOUNTER — Other Ambulatory Visit: Payer: Self-pay

## 2023-01-04 ENCOUNTER — Encounter: Payer: Self-pay | Admitting: Nurse Practitioner

## 2023-01-04 ENCOUNTER — Ambulatory Visit (INDEPENDENT_AMBULATORY_CARE_PROVIDER_SITE_OTHER): Payer: Self-pay | Admitting: Nurse Practitioner

## 2023-01-04 ENCOUNTER — Other Ambulatory Visit: Payer: Self-pay

## 2023-01-04 VITALS — BP 132/87 | HR 78 | Temp 97.9°F | Wt 263.6 lb

## 2023-01-04 DIAGNOSIS — J45909 Unspecified asthma, uncomplicated: Secondary | ICD-10-CM

## 2023-01-04 DIAGNOSIS — E1165 Type 2 diabetes mellitus with hyperglycemia: Secondary | ICD-10-CM

## 2023-01-04 DIAGNOSIS — Z13220 Encounter for screening for lipoid disorders: Secondary | ICD-10-CM

## 2023-01-04 DIAGNOSIS — J4520 Mild intermittent asthma, uncomplicated: Secondary | ICD-10-CM

## 2023-01-04 LAB — POCT GLYCOSYLATED HEMOGLOBIN (HGB A1C): Hemoglobin A1C: 13 % — AB (ref 4.0–5.6)

## 2023-01-04 MED ORDER — ALBUTEROL SULFATE HFA 108 (90 BASE) MCG/ACT IN AERS
1.0000 | INHALATION_SPRAY | Freq: Four times a day (QID) | RESPIRATORY_TRACT | 0 refills | Status: DC | PRN
  Filled 2023-01-04: qty 1, fill #0

## 2023-01-04 MED ORDER — BUDESONIDE-FORMOTEROL FUMARATE 80-4.5 MCG/ACT IN AERO
2.0000 | INHALATION_SPRAY | Freq: Two times a day (BID) | RESPIRATORY_TRACT | 12 refills | Status: DC
  Filled 2023-01-04: qty 10.2, 25d supply, fill #0
  Filled 2023-01-04: qty 1, fill #0

## 2023-01-04 NOTE — Progress Notes (Signed)
@Patient  ID: Cole Morales, male    DOB: July 23, 1976, 47 y.o.   MRN: [id number]  Chief Complaint  Patient presents with   Diabetes    Referring provider: Ivonne Andrew, NP   HPI  47 year old male with history of asthma, sleep apnea, GERD, diabetes, vitamin D deficiency, hyperlipidemia, obesity.    Patient presents today for follow-up on diabetes.  Overall patient has been doing well.  Patient was out of his medicines for a while and failed to follow-up at last visit due to loss of insurance and his mother being sick.  He states that he has been trying to do better with his diabetic diet.  He is trying to stay active.  Patient was also offered counseling but declines at this time. Denies f/c/s, n/v/d, hemoptysis, PND, chest pain or edema.     No Known Allergies   There is no immunization history on file for this patient.  Past Medical History:  Diagnosis Date   Anxiety 01/2020   Asthma    GERD (gastroesophageal reflux disease)    Grief 01/2020   Hyperlipidemia, mixed 07/2019   Obesity    Vitamin D deficiency 07/2019    Tobacco History: Social History   Tobacco Use  Smoking Status Former   Types: Cigarettes   Quit date: 01/19/2018   Years since quitting: 4.9  Smokeless Tobacco Never   Counseling given: Not Answered   Outpatient Encounter Medications as of 01/04/2023  Medication Sig   albuterol (PROVENTIL) (2.5 MG/3ML) 0.083% nebulizer solution Take 3 mLs (2.5 mg total) by nebulization every 6 (six) hours as needed for wheezing or shortness of breath.   atorvastatin (LIPITOR) 20 MG tablet Take 1 tablet (20 mg total) by mouth daily.   blood glucose meter kit and supplies KIT Dispense based on patient and insurance preference. Use up to four times daily as directed.   cetirizine (ZYRTEC) 10 MG tablet TAKE 1 TABLET(10 MG) BY MOUTH DAILY   fluticasone (FLONASE) 50 MCG/ACT nasal spray Place 2 sprays into both nostrils daily.   ibuprofen (ADVIL) 800 MG tablet TAKE 1  TABLET(800 MG) BY MOUTH EVERY 8 HOURS AS NEEDED   metFORMIN (GLUCOPHAGE-XR) 500 MG 24 hr tablet Take 1 tablet (500 mg total) by mouth 2 (two) times daily with a meal.   omeprazole (PRILOSEC) 40 MG capsule TAKE 1 CAPSULE(40 MG) BY MOUTH TWICE DAILY   [DISCONTINUED] albuterol (VENTOLIN HFA) 108 (90 Base) MCG/ACT inhaler Inhale 1-2 puffs into the lungs every 6 (six) hours as needed for wheezing or shortness of breath.   [DISCONTINUED] budesonide-formoterol (SYMBICORT) 80-4.5 MCG/ACT inhaler Inhale 2 puffs into the lungs 2 (two) times daily.   albuterol (VENTOLIN HFA) 108 (90 Base) MCG/ACT inhaler Inhale 1-2 puffs into the lungs every 6 (six) hours as needed for wheezing or shortness of breath.   budesonide-formoterol (SYMBICORT) 80-4.5 MCG/ACT inhaler Inhale 2 puffs into the lungs 2 (two) times daily.   traZODone (DESYREL) 50 MG tablet Take 0.5 tablets (25 mg total) by mouth at bedtime as needed for sleep. (Patient not taking: Reported on 07/03/2022)   No facility-administered encounter medications on file as of 01/04/2023.     Review of Systems  Review of Systems  Constitutional: Negative.   HENT: Negative.    Cardiovascular: Negative.   Gastrointestinal: Negative.   Allergic/Immunologic: Negative.   Neurological: Negative.   Psychiatric/Behavioral: Negative.         Physical Exam  BP 132/87   Pulse 78   Temp 97.9 F (36.6  C)   Wt 263 lb 9.6 oz (119.6 kg)   SpO2 98%   BMI 38.93 kg/m   Wt Readings from Last 5 Encounters:  01/04/23 263 lb 9.6 oz (119.6 kg)  06/06/22 271 lb (122.9 kg)  03/05/22 266 lb (120.7 kg)  01/03/22 268 lb 12.8 oz (121.9 kg)  12/04/21 267 lb 3.2 oz (121.2 kg)     Physical Exam Vitals and nursing note reviewed.  Constitutional:      General: He is not in acute distress.    Appearance: He is well-developed.  Cardiovascular:     Rate and Rhythm: Normal rate and regular rhythm.  Pulmonary:     Effort: Pulmonary effort is normal.     Breath sounds:  Normal breath sounds.  Skin:    General: Skin is warm and dry.  Neurological:     Mental Status: He is alert and oriented to person, place, and time.      Lab Results:  CBC    Component Value Date/Time   WBC 8.2 06/07/2022 0746   WBC 6.5 08/16/2018 0158   RBC 5.66 06/07/2022 0746   RBC 5.75 08/16/2018 0158   HGB 15.5 06/07/2022 0746   HCT 46.7 06/07/2022 0746   PLT 238 06/07/2022 0746   MCV 83 06/07/2022 0746   MCH 27.4 06/07/2022 0746   MCH 28.2 08/16/2018 0158   MCHC 33.2 06/07/2022 0746   MCHC 33.8 08/16/2018 0158   RDW 13.5 06/07/2022 0746   LYMPHSABS 2.0 08/19/2020 1416   EOSABS 0.1 08/19/2020 1416   BASOSABS 0.1 08/19/2020 1416    BMET    Component Value Date/Time   NA 141 06/14/2022 1218   K 4.2 06/14/2022 1218   CL 101 06/14/2022 1218   CO2 24 06/14/2022 1218   GLUCOSE 143 (H) 06/14/2022 1218   GLUCOSE 111 (H) 08/16/2018 0158   BUN 9 06/14/2022 1218   CREATININE 0.86 06/14/2022 1218   CALCIUM 9.6 06/14/2022 1218   GFRNONAA 85 08/19/2020 1416   GFRAA 99 08/19/2020 1416      Assessment & Plan:   Moderate asthma without complication - albuterol (VENTOLIN HFA) 108 (90 Base) MCG/ACT inhaler; Inhale 1-2 puffs into the lungs every 6 (six) hours as needed for wheezing or shortness of breath.  Dispense: 1 each; Refill: 0   2. Mild intermittent asthma, unspecified whether complicated  - budesonide-formoterol (SYMBICORT) 80-4.5 MCG/ACT inhaler; Inhale 2 puffs into the lungs 2 (two) times daily.  Dispense: 1 each; Refill: 12   3. Type 2 diabetes mellitus with hyperglycemia, without long-term current use of insulin (HCC)  - POCT glycosylated hemoglobin (Hb A1C) - Microalbumin/Creatinine Ratio, Urine - Basic Metabolic Panel - CBC   4. Lipid screening  - Lipid Panel   Follow up:  Follow up in 3 months     Ivonne Andrew, NP 01/04/2023

## 2023-01-04 NOTE — Assessment & Plan Note (Signed)
-   albuterol (VENTOLIN HFA) 108 (90 Base) MCG/ACT inhaler; Inhale 1-2 puffs into the lungs every 6 (six) hours as needed for wheezing or shortness of breath.  Dispense: 1 each; Refill: 0   2. Mild intermittent asthma, unspecified whether complicated  - budesonide-formoterol (SYMBICORT) 80-4.5 MCG/ACT inhaler; Inhale 2 puffs into the lungs 2 (two) times daily.  Dispense: 1 each; Refill: 12   3. Type 2 diabetes mellitus with hyperglycemia, without long-term current use of insulin (HCC)  - POCT glycosylated hemoglobin (Hb A1C) - Microalbumin/Creatinine Ratio, Urine - Basic Metabolic Panel - CBC   4. Lipid screening  - Lipid Panel   Follow up:  Follow up in 3 months

## 2023-01-04 NOTE — Patient Instructions (Addendum)
1. Moderate asthma without complication, unspecified whether persistent  - albuterol (VENTOLIN HFA) 108 (90 Base) MCG/ACT inhaler; Inhale 1-2 puffs into the lungs every 6 (six) hours as needed for wheezing or shortness of breath.  Dispense: 1 each; Refill: 0   2. Mild intermittent asthma, unspecified whether complicated  - budesonide-formoterol (SYMBICORT) 80-4.5 MCG/ACT inhaler; Inhale 2 puffs into the lungs 2 (two) times daily.  Dispense: 1 each; Refill: 12   3. Type 2 diabetes mellitus with hyperglycemia, without long-term current use of insulin (HCC)  - POCT glycosylated hemoglobin (Hb A1C) - Microalbumin/Creatinine Ratio, Urine - Basic Metabolic Panel - CBC   4. Lipid screening  - Lipid Panel   Follow up:  Follow up in 3 months

## 2023-01-05 LAB — CBC
Hematocrit: 46 % (ref 37.5–51.0)
Hemoglobin: 15.1 g/dL (ref 13.0–17.7)
MCH: 26.7 pg (ref 26.6–33.0)
MCHC: 32.8 g/dL (ref 31.5–35.7)
MCV: 81 fL (ref 79–97)
Platelets: 206 10*3/uL (ref 150–450)
RBC: 5.65 x10E6/uL (ref 4.14–5.80)
RDW: 13.2 % (ref 11.6–15.4)
WBC: 6.7 10*3/uL (ref 3.4–10.8)

## 2023-01-05 LAB — LIPID PANEL
Chol/HDL Ratio: 5.8 ratio — ABNORMAL HIGH (ref 0.0–5.0)
Cholesterol, Total: 157 mg/dL (ref 100–199)
HDL: 27 mg/dL — ABNORMAL LOW (ref >39)
LDL Chol Calc (NIH): 87 mg/dL (ref 0–99)
Triglycerides: 254 mg/dL — ABNORMAL HIGH (ref 0–149)
VLDL Cholesterol Cal: 43 mg/dL — ABNORMAL HIGH (ref 5–40)

## 2023-01-05 LAB — MICROALBUMIN / CREATININE URINE RATIO
Creatinine, Urine: 78.3 mg/dL
Microalb/Creat Ratio: 17 mg/g creat (ref 0–29)
Microalbumin, Urine: 13.5 ug/mL

## 2023-01-05 LAB — BASIC METABOLIC PANEL
BUN/Creatinine Ratio: 8 — ABNORMAL LOW (ref 9–20)
BUN: 7 mg/dL (ref 6–24)
CO2: 23 mmol/L (ref 20–29)
Calcium: 9.3 mg/dL (ref 8.7–10.2)
Chloride: 98 mmol/L (ref 96–106)
Creatinine, Ser: 0.84 mg/dL (ref 0.76–1.27)
Glucose: 379 mg/dL — ABNORMAL HIGH (ref 70–99)
Potassium: 3.9 mmol/L (ref 3.5–5.2)
Sodium: 138 mmol/L (ref 134–144)
eGFR: 108 mL/min/{1.73_m2} (ref >59)

## 2023-01-21 ENCOUNTER — Other Ambulatory Visit: Payer: BLUE CROSS/BLUE SHIELD | Admitting: Pharmacist

## 2023-01-21 DIAGNOSIS — J45909 Unspecified asthma, uncomplicated: Secondary | ICD-10-CM

## 2023-01-21 MED ORDER — ALBUTEROL SULFATE HFA 108 (90 BASE) MCG/ACT IN AERS
1.0000 | INHALATION_SPRAY | Freq: Four times a day (QID) | RESPIRATORY_TRACT | 1 refills | Status: AC | PRN
Start: 2023-01-21 — End: ?

## 2023-01-21 NOTE — Progress Notes (Signed)
01/21/2023 Name: Cole Morales MRN: [id number] DOB: 09-16-1975  Chief Complaint  Patient presents with   Medication Management   Hypertension   Diabetes   Hyperlipidemia    Cole Morales is a 47 y.o. year old male who presented for a telephone visit.   They were referred to the pharmacist by their PCP for assistance in managing diabetes, hypertension, and hyperlipidemia.   Subjective:  Care Team: Primary Care Provider: Ivonne Andrew, NP ; Next Scheduled Visit: 03/2023  Medication Access/Adherence  Current Pharmacy:  [facility] #[id number] - [place], [place] - [address] AT [facility] [address] [place] [address] Phone: [phone] Fax: [phone]  [facility] [address], [address] [place] [address] Phone: [phone] Fax: [phone]   Patient reports affordability concerns with their medications: Yes  Patient reports access/transportation concerns to their pharmacy: No  Patient reports adherence concerns with their medications:  No     Diabetes:  Current medications: metformin XR 500 mg twice daily  Current glucose readings: none; reports his glucometer broke; he called the manufacturer and they are supposed to be sending him a new one. He cannot afford to buy a new one at this time. His BCBS insurance is still not working and he is working with the insurance to figure out a solution.   Patient denies hypoglycemic s/sx including dizziness, shakiness, sweating. Patient denies hyperglycemic symptoms including polyuria, polydipsia, polyphagia, nocturia, neuropathy, blurred vision.  Hyperlipidemia/ASCVD Risk Reduction  Current lipid lowering medications: atorvastatin 20 mg daily  Asthma:  Current medications: Symbicort 160/4.5 mcg 2 puffs twice daily; albuterol HFA PRN   Requests albuterol HFA to [facility] pharmacy in [place].     Objective:  Lab Results  Component Value Date   HGBA1C 13.0 (A) 01/04/2023    Lab Results  Component Value Date   CREATININE 0.84 01/04/2023   BUN 7 01/04/2023   NA 138 01/04/2023   K 3.9 01/04/2023   CL 98 01/04/2023   CO2 23 01/04/2023    Lab Results  Component Value Date   CHOL 157 01/04/2023   HDL 27 (L) 01/04/2023   LDLCALC 87 01/04/2023   TRIG 254 (H) 01/04/2023   CHOLHDL 5.8 (H) 01/04/2023    Medications Reviewed Today     Reviewed by Alden Hipp, RPH-CPP (Pharmacist) on 01/21/23 at 1444  Med List Status: <None>   Medication Order Taking? Sig Documenting Provider Last Dose Status Informant  albuterol (PROVENTIL) (2.5 MG/3ML) 0.083% nebulizer solution [id number] No Take 3 mLs (2.5 mg total) by nebulization every 6 (six) hours as needed for wheezing or shortness of breath.  Patient not taking: Reported on 01/21/2023   Ivonne Andrew, NP Not Taking Active            Med Note Sherilyn Cooter, [facility]   Fri Jan 04, 2023  2:21 PM) prn  albuterol (VENTOLIN HFA) 108 (90 Base) MCG/ACT inhaler [id number]  Inhale 1-2 puffs into the lungs every 6 (six) hours as needed for wheezing or shortness of breath. Ivonne Andrew, NP  Active   atorvastatin (LIPITOR) 20 MG tablet [id number] Yes Take 1 tablet (20 mg total) by mouth daily. Ivonne Andrew, NP Taking Active   blood glucose meter kit and supplies KIT [id number]  Dispense based on patient and insurance preference. Use up to four times daily as directed. Barbette Merino, NP  Active   budesonide-formoterol (  SYMBICORT) 80-4.5 MCG/ACT inhaler [id number] Yes Inhale 2 puffs into the lungs 2 (two) times daily. Ivonne Andrew, NP Taking Active   cetirizine (ZYRTEC) 10 MG tablet [id number] Yes TAKE 1 TABLET(10 MG) BY MOUTH DAILY Ivonne Andrew, NP Taking Active   fluticasone (FLONASE) 50 MCG/ACT nasal spray [id number] Yes Place 2 sprays into both nostrils daily. Barbette Merino, NP Taking Active   ibuprofen (ADVIL) 800 MG tablet  [id number]  TAKE 1 TABLET(800 MG) BY MOUTH EVERY 8 HOURS AS NEEDED Kallie Locks, FNP  Active            Med Note Sherilyn Cooter, [facility]   Fri Jan 04, 2023  2:22 PM) prn  metFORMIN (GLUCOPHAGE-XR) 500 MG 24 hr tablet [id number] Yes Take 1 tablet (500 mg total) by mouth 2 (two) times daily with a meal. Ivonne Andrew, NP Taking Active   omeprazole (PRILOSEC) 40 MG capsule [id number] Yes TAKE 1 CAPSULE(40 MG) BY MOUTH TWICE DAILY Kallie Locks, FNP Taking Active            Med Note (LONG, ASHLEY L   Mon Dec 04, 2021  1:14 PM) otc  traZODone (DESYREL) 50 MG tablet [id number] No Take 0.5 tablets (25 mg total) by mouth at bedtime as needed for sleep.  Patient not taking: Reported on 07/03/2022   Ivonne Andrew, NP Not Taking Active               Assessment/Plan:   Diabetes: - Currently uncontrolled - Reviewed long term cardiovascular and renal outcomes of uncontrolled blood sugar - Reviewed goal A1c, goal fasting, and goal 2 hour post prandial glucose - Recommend to increase metformin. Patient would like to see blood sugar readings first. Continue current regimen and current solution to obtain glucometer.    Hyperlipidemia/ASCVD Risk Reduction: - Currently uncontrolled, goal LDL <70, though was out of atorvastatin for a few weeks during the prior few months.  - Recommend to continue current regimen at this time   Follow Up Plan: phone call in 4 weeks  Catie Eppie Gibson, PharmD, BCACP, CPP [facility] [phone]

## 2023-02-18 ENCOUNTER — Other Ambulatory Visit: Payer: BLUE CROSS/BLUE SHIELD | Admitting: Pharmacist

## 2023-02-19 ENCOUNTER — Other Ambulatory Visit: Payer: BLUE CROSS/BLUE SHIELD | Admitting: Pharmacist

## 2023-02-19 ENCOUNTER — Telehealth: Payer: Self-pay | Admitting: Pharmacist

## 2023-02-19 NOTE — Progress Notes (Unsigned)
Attempted to contact patient for scheduled appointment for medication management. Left HIPAA compliant message for patient to return my call at their convenience.   Catie T. Jessic Standifer, PharmD, BCACP, CPP Clinical Pharmacist [facility] [phone]  

## 2023-03-11 ENCOUNTER — Telehealth: Payer: Self-pay

## 2023-03-11 NOTE — Progress Notes (Signed)
   Care Guide Note  03/11/2023 Name: Cole Morales MRN: [id number] DOB: 03/26/1976  Referred by: Ivonne Andrew, NP Reason for referral : Care Coordination (Outreach to reschedule with pharm d )   [name] is a 47 y.o. year old male who is a primary care patient of Ivonne Andrew, NP. Luz Brazen was referred to the pharmacist for assistance related to DM.    An unsuccessful telephone outreach was attempted today to contact the patient who was referred to the pharmacy team for assistance with medication management. Additional attempts will be made to contact the patient.   Penne Lash, RMA Care Guide [facility]  [place], [place] [address] Direct Dial: [phone] [email or web address]

## 2023-03-12 ENCOUNTER — Other Ambulatory Visit: Payer: Self-pay

## 2023-03-15 ENCOUNTER — Other Ambulatory Visit: Payer: Self-pay

## 2023-03-29 NOTE — Progress Notes (Signed)
  Care Coordination Note  03/29/2023 Name: [name] MRN: [id number] DOB: 28-Mar-1976  Cole Morales is a 47 y.o. year old male who is a primary care patient of Ivonne Andrew, NP and is actively engaged with the Chronic Care Management team. I reached out to [facility] by phone today to assist with re-scheduling a follow up visit with the Pharmacist  Follow up plan: Unsuccessful telephone outreach attempt made. A HIPAA compliant phone message was left for the patient providing contact information and requesting a return call.  If patient returns call to provider office, please advise to call CCM Care Guide Penne Lash  at [phone]  Penne Lash, RMA Care Guide [facility]  [place], [place] [address] Direct Dial: [phone] [email or web address]

## 2023-04-08 ENCOUNTER — Ambulatory Visit: Payer: Self-pay | Admitting: Nurse Practitioner

## 2023-04-12 ENCOUNTER — Other Ambulatory Visit: Payer: Self-pay

## 2023-04-16 ENCOUNTER — Telehealth: Payer: Self-pay

## 2023-04-16 NOTE — Progress Notes (Signed)
Care Guide Note  04/16/2023 Name: Cole Morales MRN: [id number] DOB: [birth year]  Referred by: Ivonne Andrew, NP Reason for referral : Care Coordination (Outreach to reschedule f/u with Pharm d )   Cole Morales is a 47 y.o. year old male who is a primary care patient of Ivonne Andrew, NP. Luz Brazen was referred to the pharmacist for assistance related to DM.    An unsuccessful telephone outreach was attempted today to contact the patient who was referred to the pharmacy team for assistance with medication management. Additional attempts will be made to contact the patient.   Penne Lash, RMA Care Guide [facility]  [place], [place] [address] Direct Dial: [phone] [email or web address]

## 2023-04-19 NOTE — Progress Notes (Signed)
  Care Coordination Note  04/19/2023 Name: Cole Morales MRN: [id number] DOB: 1976/04/07  Cole Morales is a 47 y.o. year old male who is a primary care patient of Ivonne Andrew, NP and is actively engaged with the Chronic Care Management team. I reached out to [facility] by phone today to assist with re-scheduling a follow up visit with the Pharmacist  Follow up plan: Unable to make contact on outreach attempts x 3. PCP Ivonne Andrew, NP notified via routed documentation in medical record.   Penne Lash, RMA Care Guide [facility]  [place], [place] [address] Direct Dial: [phone] [email or web address]

## 2023-05-06 NOTE — Progress Notes (Signed)
Care Guide Note  05/06/2023 Name: Josiya June MRN: [id number] DOB: [birth year]  Referred by: Ivonne Andrew, NP Reason for referral : Care Coordination (Outreach to reschedule f/u with Pharm d )   Othie Pickar is a 47 y.o. year old male who is a primary care patient of Ivonne Andrew, NP. Luz Brazen was referred to the pharmacist for assistance related to DM.    A second unsuccessful telephone outreach was attempted today to contact the patient who was referred to the pharmacy team for assistance with medication management. Additional attempts will be made to contact the patient.  Penne Lash, RMA Care Guide [facility]  [place], [place] [address] Direct Dial: [phone] [email or web address]

## 2023-05-17 NOTE — Progress Notes (Signed)
Care Coordination Note  05/17/2023 Name: Cole Morales MRN: [id number] DOB: 12-25-1975  Cole Morales is a 47 y.o. year old male who is a primary care patient of Ivonne Andrew, NP and is actively engaged with the care management team. I reached out to [facility] by phone today to assist with re-scheduling a follow up visit with the Pharmacist  Follow up plan: Unable to make contact on outreach attempts x 3. PCP Ivonne Andrew, NP notified via routed documentation in medical record.   Penne Lash, RMA Care Guide [facility]  [place], [place] [address] Direct Dial: [phone] [email or web address]

## 2023-06-14 ENCOUNTER — Other Ambulatory Visit: Payer: Self-pay

## 2023-09-13 ENCOUNTER — Other Ambulatory Visit: Payer: Self-pay

## 2023-12-16 ENCOUNTER — Other Ambulatory Visit: Payer: Self-pay | Admitting: Nurse Practitioner

## 2023-12-16 DIAGNOSIS — E785 Hyperlipidemia, unspecified: Secondary | ICD-10-CM

## 2023-12-16 DIAGNOSIS — E782 Mixed hyperlipidemia: Secondary | ICD-10-CM

## 2023-12-16 DIAGNOSIS — E1165 Type 2 diabetes mellitus with hyperglycemia: Secondary | ICD-10-CM

## 2023-12-17 ENCOUNTER — Other Ambulatory Visit: Payer: Self-pay

## 2023-12-17 MED ORDER — ATORVASTATIN CALCIUM 20 MG PO TABS
20.0000 mg | ORAL_TABLET | Freq: Every day | ORAL | 3 refills | Status: AC
  Filled 2023-12-17: qty 90, 90d supply, fill #0
  Filled 2024-03-18 – 2024-03-19 (×2): qty 90, 90d supply, fill #1
  Filled 2024-06-16 – 2024-06-17 (×2): qty 90, 90d supply, fill #2
  Filled 2024-09-17 (×2): qty 90, 90d supply, fill #3

## 2023-12-19 ENCOUNTER — Other Ambulatory Visit: Payer: Self-pay

## 2024-01-13 ENCOUNTER — Other Ambulatory Visit: Payer: Self-pay | Admitting: Nurse Practitioner

## 2024-01-13 DIAGNOSIS — J4520 Mild intermittent asthma, uncomplicated: Secondary | ICD-10-CM

## 2024-01-13 NOTE — Telephone Encounter (Signed)
 Copied from CRM [id number]. Topic: Clinical - Medication Refill >> Jan 13, 2024  3:27 PM Oddis Bench wrote: Medication: budesonide -formoterol  (SYMBICORT ) 80-4.5 MCG/ACT inhaler  Has the patient contacted their pharmacy? Yes Tol reach out to PCP  This is the patient's preferred pharmacy:  [id number] - [place], [place] - [address] [place] [address] Phone: [phone] Fax: [phone]    Is this the correct pharmacy for this prescription? Yes If no, delete pharmacy and type the correct one.   Has the prescription been filled recently? Yes  Is the patient out of the medication? Yes  Has the patient been seen for an appointment in the last year OR does the patient have an upcoming appointment? Yes  Can we respond through MyChart? No  Agent: Please be advised that Rx refills may take up to 3 business days. We ask that you follow-up with your pharmacy.

## 2024-01-14 MED ORDER — BUDESONIDE-FORMOTEROL FUMARATE 80-4.5 MCG/ACT IN AERO
2.0000 | INHALATION_SPRAY | Freq: Two times a day (BID) | RESPIRATORY_TRACT | 12 refills | Status: AC
Start: 2024-01-14 — End: ?

## 2024-03-19 ENCOUNTER — Other Ambulatory Visit: Payer: Self-pay

## 2024-06-11 ENCOUNTER — Other Ambulatory Visit (HOSPITAL_COMMUNITY): Payer: Self-pay

## 2024-06-17 ENCOUNTER — Other Ambulatory Visit: Payer: Self-pay

## 2024-06-22 ENCOUNTER — Other Ambulatory Visit: Payer: Self-pay

## 2024-09-17 ENCOUNTER — Other Ambulatory Visit: Payer: Self-pay

## 2024-09-18 ENCOUNTER — Other Ambulatory Visit: Payer: Self-pay
